# Patient Record
Sex: Male | Born: 1955 | Race: White | Hispanic: No | Marital: Married | Smoking: Never smoker
Health system: Southern US, Community
[De-identification: ages and names within clinical notes are randomized; demographics above are authoritative.]

## PROBLEM LIST (undated history)

## (undated) DIAGNOSIS — G459 Transient cerebral ischemic attack, unspecified: Secondary | ICD-10-CM

## (undated) DIAGNOSIS — R0600 Dyspnea, unspecified: Secondary | ICD-10-CM

## (undated) DIAGNOSIS — N289 Disorder of kidney and ureter, unspecified: Secondary | ICD-10-CM

## (undated) DIAGNOSIS — I2510 Atherosclerotic heart disease of native coronary artery without angina pectoris: Secondary | ICD-10-CM

## (undated) DIAGNOSIS — R011 Cardiac murmur, unspecified: Secondary | ICD-10-CM

## (undated) DIAGNOSIS — G4730 Sleep apnea, unspecified: Secondary | ICD-10-CM

## (undated) DIAGNOSIS — M1990 Unspecified osteoarthritis, unspecified site: Secondary | ICD-10-CM

## (undated) DIAGNOSIS — I639 Cerebral infarction, unspecified: Secondary | ICD-10-CM

## (undated) DIAGNOSIS — Z7960 Long term (current) use of unspecified immunomodulators and immunosuppressants: Secondary | ICD-10-CM

## (undated) DIAGNOSIS — F419 Anxiety disorder, unspecified: Secondary | ICD-10-CM

## (undated) DIAGNOSIS — R42 Dizziness and giddiness: Secondary | ICD-10-CM

## (undated) DIAGNOSIS — Z79899 Other long term (current) drug therapy: Secondary | ICD-10-CM

## (undated) DIAGNOSIS — G4733 Obstructive sleep apnea (adult) (pediatric): Secondary | ICD-10-CM

## (undated) DIAGNOSIS — Z950 Presence of cardiac pacemaker: Secondary | ICD-10-CM

## (undated) DIAGNOSIS — F411 Generalized anxiety disorder: Secondary | ICD-10-CM

## (undated) DIAGNOSIS — I495 Sick sinus syndrome: Secondary | ICD-10-CM

## (undated) DIAGNOSIS — R0609 Other forms of dyspnea: Secondary | ICD-10-CM

## (undated) DIAGNOSIS — C44309 Unspecified malignant neoplasm of skin of other parts of face: Secondary | ICD-10-CM

## (undated) DIAGNOSIS — Z9889 Other specified postprocedural states: Secondary | ICD-10-CM

## (undated) DIAGNOSIS — C801 Malignant (primary) neoplasm, unspecified: Secondary | ICD-10-CM

## (undated) DIAGNOSIS — L4050 Arthropathic psoriasis, unspecified: Secondary | ICD-10-CM

## (undated) DIAGNOSIS — I5189 Other ill-defined heart diseases: Secondary | ICD-10-CM

## (undated) DIAGNOSIS — E785 Hyperlipidemia, unspecified: Secondary | ICD-10-CM

## (undated) DIAGNOSIS — I10 Essential (primary) hypertension: Secondary | ICD-10-CM

## (undated) HISTORY — DX: Sick sinus syndrome: I49.5

## (undated) HISTORY — DX: Atherosclerotic heart disease of native coronary artery without angina pectoris: I25.10

## (undated) HISTORY — DX: Other forms of dyspnea: R06.09

## (undated) HISTORY — PX: COLONOSCOPY: SHX174

## (undated) HISTORY — DX: Disorder of kidney and ureter, unspecified: N28.9

## (undated) HISTORY — PX: TONSILLECTOMY: SUR1361

## (undated) HISTORY — DX: Other ill-defined heart diseases: I51.89

## (undated) HISTORY — DX: Generalized anxiety disorder: F41.1

## (undated) HISTORY — PX: CARDIAC CATHETERIZATION: SHX172

## (undated) HISTORY — DX: Transient cerebral ischemic attack, unspecified: G45.9

## (undated) HISTORY — PX: TOTAL ANKLE REPLACEMENT: SUR1218

## (undated) HISTORY — PX: DIAGNOSTIC LAPAROSCOPY: SUR761

## (undated) HISTORY — DX: Dizziness and giddiness: R42

## (undated) HISTORY — DX: Arthropathic psoriasis, unspecified: L40.50

## (undated) HISTORY — DX: Dyspnea, unspecified: R06.00

---
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
## 1983-03-14 DIAGNOSIS — Z950 Presence of cardiac pacemaker: Secondary | ICD-10-CM

## 1983-03-14 HISTORY — PX: PACEMAKER INSERTION: SHX728

## 1983-03-14 HISTORY — DX: Presence of cardiac pacemaker: Z95.0

## 2000-05-23 ENCOUNTER — Encounter: Admission: RE | Admit: 2000-05-23 | Discharge: 2000-05-23 | Payer: Self-pay | Admitting: Internal Medicine

## 2002-03-13 DIAGNOSIS — Z9889 Other specified postprocedural states: Secondary | ICD-10-CM

## 2002-03-13 HISTORY — DX: Other specified postprocedural states: Z98.890

## 2004-03-13 HISTORY — PX: HAND SURGERY: SHX662

## 2005-03-13 HISTORY — PX: JOINT REPLACEMENT: SHX530

## 2005-07-11 ENCOUNTER — Ambulatory Visit: Payer: Self-pay | Admitting: Unknown Physician Specialty

## 2005-11-20 ENCOUNTER — Emergency Department: Payer: Self-pay | Admitting: General Practice

## 2006-03-13 HISTORY — PX: CHOLECYSTECTOMY: SHX55

## 2006-08-15 ENCOUNTER — Other Ambulatory Visit: Payer: Self-pay

## 2006-08-15 ENCOUNTER — Ambulatory Visit: Payer: Self-pay | Admitting: Surgery

## 2006-08-17 ENCOUNTER — Ambulatory Visit: Payer: Self-pay | Admitting: Surgery

## 2007-02-08 ENCOUNTER — Ambulatory Visit: Payer: Self-pay | Admitting: Cardiology

## 2007-02-22 ENCOUNTER — Ambulatory Visit: Payer: Self-pay | Admitting: Otolaryngology

## 2007-03-14 HISTORY — PX: SHOULDER SURGERY: SHX246

## 2007-03-28 ENCOUNTER — Ambulatory Visit: Payer: Self-pay | Admitting: Internal Medicine

## 2007-04-25 ENCOUNTER — Ambulatory Visit: Payer: Self-pay | Admitting: Internal Medicine

## 2007-05-10 ENCOUNTER — Ambulatory Visit: Payer: Self-pay | Admitting: Internal Medicine

## 2007-08-05 ENCOUNTER — Ambulatory Visit: Payer: Self-pay | Admitting: General Practice

## 2007-08-20 ENCOUNTER — Inpatient Hospital Stay: Payer: Self-pay | Admitting: General Practice

## 2008-03-13 HISTORY — PX: ELBOW SURGERY: SHX618

## 2009-03-13 HISTORY — PX: ICD GENERATOR CHANGE: SHX5854

## 2009-04-09 ENCOUNTER — Ambulatory Visit (HOSPITAL_COMMUNITY): Admission: RE | Admit: 2009-04-09 | Discharge: 2009-04-09 | Payer: Self-pay | Admitting: Neurosurgery

## 2009-06-07 ENCOUNTER — Ambulatory Visit: Payer: Self-pay | Admitting: Internal Medicine

## 2009-06-08 ENCOUNTER — Inpatient Hospital Stay: Payer: Self-pay | Admitting: Internal Medicine

## 2009-06-23 ENCOUNTER — Ambulatory Visit: Payer: Self-pay | Admitting: Unknown Physician Specialty

## 2009-08-03 ENCOUNTER — Ambulatory Visit: Payer: Self-pay | Admitting: Pain Medicine

## 2009-08-11 ENCOUNTER — Ambulatory Visit: Payer: Self-pay | Admitting: Pain Medicine

## 2009-09-07 ENCOUNTER — Ambulatory Visit: Payer: Self-pay | Admitting: Pain Medicine

## 2009-09-20 ENCOUNTER — Ambulatory Visit: Payer: Self-pay | Admitting: Pain Medicine

## 2009-11-02 ENCOUNTER — Ambulatory Visit: Payer: Self-pay | Admitting: Pain Medicine

## 2009-11-24 ENCOUNTER — Ambulatory Visit: Payer: Self-pay | Admitting: Pain Medicine

## 2010-01-13 ENCOUNTER — Ambulatory Visit: Payer: Self-pay | Admitting: Pain Medicine

## 2010-03-09 ENCOUNTER — Ambulatory Visit: Payer: Self-pay | Admitting: Pain Medicine

## 2010-03-22 ENCOUNTER — Ambulatory Visit: Payer: Self-pay | Admitting: Pain Medicine

## 2010-03-28 ENCOUNTER — Inpatient Hospital Stay: Payer: Self-pay | Admitting: Internal Medicine

## 2010-04-18 ENCOUNTER — Ambulatory Visit: Payer: Self-pay | Admitting: Surgery

## 2010-05-10 ENCOUNTER — Ambulatory Visit: Payer: Self-pay | Admitting: Pain Medicine

## 2010-05-13 ENCOUNTER — Ambulatory Visit: Payer: Self-pay | Admitting: Surgery

## 2010-05-16 ENCOUNTER — Ambulatory Visit: Payer: Self-pay | Admitting: Pain Medicine

## 2010-05-16 LAB — PATHOLOGY REPORT

## 2010-05-20 ENCOUNTER — Ambulatory Visit: Payer: Self-pay | Admitting: General Practice

## 2010-06-10 ENCOUNTER — Ambulatory Visit: Payer: Self-pay | Admitting: General Practice

## 2010-06-24 ENCOUNTER — Ambulatory Visit: Payer: Self-pay | Admitting: General Practice

## 2010-06-30 ENCOUNTER — Ambulatory Visit: Payer: Self-pay | Admitting: Cardiology

## 2010-07-01 ENCOUNTER — Ambulatory Visit: Payer: Self-pay | Admitting: Cardiology

## 2010-07-05 ENCOUNTER — Ambulatory Visit: Payer: Self-pay | Admitting: Pain Medicine

## 2010-07-13 ENCOUNTER — Ambulatory Visit: Payer: Self-pay | Admitting: Pain Medicine

## 2010-09-08 ENCOUNTER — Ambulatory Visit: Payer: Self-pay | Admitting: Pain Medicine

## 2010-12-19 ENCOUNTER — Ambulatory Visit: Payer: Self-pay | Admitting: Pain Medicine

## 2011-01-10 ENCOUNTER — Ambulatory Visit: Payer: Self-pay | Admitting: Pain Medicine

## 2011-05-22 ENCOUNTER — Ambulatory Visit: Payer: Self-pay | Admitting: Pain Medicine

## 2011-07-11 ENCOUNTER — Ambulatory Visit: Payer: Self-pay | Admitting: Pain Medicine

## 2011-08-21 ENCOUNTER — Ambulatory Visit: Payer: Self-pay | Admitting: Pain Medicine

## 2011-09-15 ENCOUNTER — Ambulatory Visit: Payer: Self-pay | Admitting: Internal Medicine

## 2011-09-21 ENCOUNTER — Ambulatory Visit: Payer: Self-pay | Admitting: Pain Medicine

## 2011-10-16 ENCOUNTER — Ambulatory Visit: Payer: Self-pay | Admitting: Pain Medicine

## 2011-12-19 ENCOUNTER — Ambulatory Visit: Payer: Self-pay | Admitting: Pain Medicine

## 2012-01-08 ENCOUNTER — Ambulatory Visit: Payer: Self-pay | Admitting: Pain Medicine

## 2012-01-30 ENCOUNTER — Ambulatory Visit: Payer: Self-pay | Admitting: Pain Medicine

## 2012-03-05 ENCOUNTER — Encounter: Payer: Self-pay | Admitting: Rheumatology

## 2012-03-13 ENCOUNTER — Encounter: Payer: Self-pay | Admitting: Rheumatology

## 2012-06-04 ENCOUNTER — Ambulatory Visit: Payer: Self-pay | Admitting: Pain Medicine

## 2012-09-03 ENCOUNTER — Ambulatory Visit: Payer: Self-pay | Admitting: Pain Medicine

## 2012-09-05 ENCOUNTER — Ambulatory Visit: Payer: Self-pay | Admitting: Orthopedic Surgery

## 2012-09-10 ENCOUNTER — Ambulatory Visit: Payer: Self-pay | Admitting: Orthopedic Surgery

## 2012-11-27 ENCOUNTER — Ambulatory Visit: Payer: Self-pay | Admitting: Pain Medicine

## 2012-12-18 ENCOUNTER — Ambulatory Visit: Payer: Self-pay | Admitting: Pain Medicine

## 2013-02-04 ENCOUNTER — Ambulatory Visit: Payer: Self-pay | Admitting: Orthopedic Surgery

## 2013-02-11 ENCOUNTER — Ambulatory Visit: Payer: Self-pay | Admitting: Pain Medicine

## 2013-03-24 DIAGNOSIS — M19079 Primary osteoarthritis, unspecified ankle and foot: Secondary | ICD-10-CM | POA: Insufficient documentation

## 2013-03-24 DIAGNOSIS — M19179 Post-traumatic osteoarthritis, unspecified ankle and foot: Secondary | ICD-10-CM | POA: Insufficient documentation

## 2013-05-13 ENCOUNTER — Ambulatory Visit: Payer: Self-pay | Admitting: Pain Medicine

## 2013-07-01 ENCOUNTER — Ambulatory Visit: Payer: Self-pay | Admitting: Pain Medicine

## 2013-07-14 DIAGNOSIS — G4730 Sleep apnea, unspecified: Secondary | ICD-10-CM | POA: Insufficient documentation

## 2013-07-14 DIAGNOSIS — Z950 Presence of cardiac pacemaker: Secondary | ICD-10-CM | POA: Insufficient documentation

## 2013-07-14 DIAGNOSIS — Z8673 Personal history of transient ischemic attack (TIA), and cerebral infarction without residual deficits: Secondary | ICD-10-CM | POA: Insufficient documentation

## 2014-01-22 DIAGNOSIS — M1990 Unspecified osteoarthritis, unspecified site: Secondary | ICD-10-CM | POA: Insufficient documentation

## 2014-03-22 ENCOUNTER — Emergency Department: Payer: Self-pay | Admitting: Internal Medicine

## 2014-03-22 LAB — COMPREHENSIVE METABOLIC PANEL
ANION GAP: 7 (ref 7–16)
AST: 48 U/L — AB (ref 15–37)
Albumin: 3.8 g/dL (ref 3.4–5.0)
Alkaline Phosphatase: 86 U/L
BILIRUBIN TOTAL: 0.9 mg/dL (ref 0.2–1.0)
BUN: 23 mg/dL — AB (ref 7–18)
CHLORIDE: 103 mmol/L (ref 98–107)
Calcium, Total: 9.3 mg/dL (ref 8.5–10.1)
Co2: 27 mmol/L (ref 21–32)
Creatinine: 1.37 mg/dL — ABNORMAL HIGH (ref 0.60–1.30)
EGFR (Non-African Amer.): 56 — ABNORMAL LOW
Glucose: 101 mg/dL — ABNORMAL HIGH (ref 65–99)
OSMOLALITY: 278 (ref 275–301)
POTASSIUM: 4.2 mmol/L (ref 3.5–5.1)
SGPT (ALT): 66 U/L — ABNORMAL HIGH
SODIUM: 137 mmol/L (ref 136–145)
TOTAL PROTEIN: 7.6 g/dL (ref 6.4–8.2)

## 2014-03-22 LAB — CBC
HCT: 45.6 % (ref 40.0–52.0)
HGB: 14.6 g/dL (ref 13.0–18.0)
MCH: 30.6 pg (ref 26.0–34.0)
MCHC: 32 g/dL (ref 32.0–36.0)
MCV: 96 fL (ref 80–100)
Platelet: 204 10*3/uL (ref 150–440)
RBC: 4.76 10*6/uL (ref 4.40–5.90)
RDW: 14.6 % — AB (ref 11.5–14.5)
WBC: 14.1 10*3/uL — AB (ref 3.8–10.6)

## 2014-03-22 LAB — LIPASE, BLOOD: Lipase: 279 U/L (ref 73–393)

## 2014-03-22 LAB — D-DIMER(ARMC): D-DIMER: 2386 ng/mL

## 2014-03-22 LAB — TROPONIN I

## 2014-03-27 DIAGNOSIS — E782 Mixed hyperlipidemia: Secondary | ICD-10-CM | POA: Insufficient documentation

## 2014-04-24 DIAGNOSIS — F411 Generalized anxiety disorder: Secondary | ICD-10-CM | POA: Insufficient documentation

## 2014-07-03 NOTE — Op Note (Signed)
PATIENT NAME:  Roger Austin, Roger Austin MR#:  [id number] DATE OF BIRTH:  December 03, 1955  DATE OF PROCEDURE:  02/04/2013  PREOPERATIVE DIAGNOSIS: Left olecranon bursitis and spur. Left lateral epicondylitis. Cyst, left middle finger PIP joint, dorsal.   POSTOPERATIVE DIAGNOSIS:  Left olecranon bursitis and spur. Left lateral epicondylitis. Cyst, left middle finger PIP joint, dorsal.   PROCEDURE: Excision, left olecranon bursa and spur. Nirschl procedure, left lateral epicondyle. Excision of cyst, left middle finger dorsal PIP joint.   ANESTHESIA: General.   SURGEON: Hessie Knows, M.D.   DESCRIPTION OF PROCEDURE: The patient was brought to the operating room and after adequate anesthesia was obtained, the arm was prepped and draped in the usual sterile fashion with a tourniquet applied to the upper arm. After prepping and draping in the usual sterile fashion, appropriate patient identification and timeout procedures were completed, and the tourniquet raised to 250 mmHg. A curvilinear incision was made over the dorsum of the PIP joint and the cyst exposed. It appeared to arise from the joint and came through the extensor mechanism. It was debrided at its base and cautery was applied to the defect in the extensor tendon, in hopes of promoting scar tissue and prevent recurrence. The wound was closed with simple interrupted 5-0 nylon skin suture with 5 mL of 0.5% Sensorcaine without epinephrine infiltrated proximal to this on the dorsum of the hand to give postoperative pain relief. Attention was then applied to the olecranon bursa and spur. A small incision was made just on the lateral aspect of the olecranon. The subcutaneous tissue was divided and bursa debrided. The palpable spur was identified and soft tissue elevated off of this. Osteotome and rongeur were used to remove the most prominent portion and a rasp used to smooth the edges. The wound was irrigated and then closed with a 2-0 Vicryl to repair portion of  the extensor mechanism that had been elevated, and 4-0 nylon for the skin. Going to the lateral epicondyle, approximately 2.5 cm incision was made directly over the lateral epicondyle. The incision was carried down through the skin and subcutaneous tissue, and the lateral epicondyle was exposed with elevation of the anterior and posterior soft tissues. There was an area of inflammatory tissue consistent with chronic lateral epicondylitis. This was debrided with use of rongeur, as was a portion of the lateral epicondyle, decorticating the bone and getting to bleeding cancellus bone. After getting  the edges smooth, the wound was irrigated, and the anterior and posterior structures repaired over this decorticated bone using a 2-0 Vicryl. Skin was closed with 4-0 nylon. A total of 15 additional milliliters were injected into the area of the lateral epicondyle and olecranon incisions for postop analgesia. The wound was then covered with Xeroform, 4 x 4's, Webril and a posterior splint to maintain 90 degrees of elbow flexion with Xeroform, 4 x 4, and 1 inch Kling around the finger. The patient was then sent to the recovery room in stable condition after letting down the tourniquet. Tourniquet time was 43 minutes at 250 mmHg.   SPECIMEN: Excised cyst, left middle finger.     ____________________________ Laurene Footman, MD mjm:dmm D: 02/04/2013 21:19:26 ET T: 02/04/2013 21:33:18 ET JOB#: [id number]  cc: Laurene Footman, MD, <Dictator> Laurene Footman MD ELECTRONICALLY SIGNED 02/05/2013 8:02

## 2014-07-03 NOTE — Op Note (Signed)
PATIENT NAME:  Roger Austin, Roger Austin MR#:  [id number] DATE OF BIRTH:  Sep 11, 1955  DATE OF PROCEDURE:  09/10/2012  PREOPERATIVE DIAGNOSIS:  Right olecranon bursitis and chronic lateral epicondylitis.  POSTOPERATIVE DIAGNOSIS:  Right olecranon bursitis and chronic lateral epicondylitis.   PROCEDURE PERFORMED:  Olecranon bursa excision and Nirscl procedure, right elbow.    ANESTHESIA:  General.  SURGEON:  Laurene Footman, M.D.   DESCRIPTION OF PROCEDURE:  The patient was brought to the Operating Room and after adequate anesthesia was obtained the right arm was prepped and draped in the usual sterile fashion.  After patient identification and timeout procedures were completed, the tourniquet was raised to 250 mmHg.  The lateral epicondyle was approached first with the incision centered directly over the epicondyle.  After exposing the bone the tendon origin was elevated off the bone.  There was granulation tissue consistent with chronic inflammation.  This was debrided with the use of a rongeur and the epicondyle was decorticated to allow for subsequent healing.  It had a smooth surface and after adequate debridement the wound was irrigated and then closed with a subcuticular 4-0 Monocryl and Dermabond.  Going to the olecranon, incision was made just radial to the olecranon approximately 1 inch in length.  There was a very thick bursa present with fluid.  This was excised off the bone as well as skin.  After this was removed the palpable spur was debrided with use of a rongeur.  The wound was irrigated and then closed in a similar fashion.  10 mL of 0.5% Sensorcaine was infiltrated around the incisions to aid in postoperative analgesia.  Sterile dressing of Telfa, ABD, Webril and a posterior splint were applied.  The patient is taken to the recovery room in stable condition.   ESTIMATED BLOOD LOSS:  Minimal.   COMPLICATIONS:  None.   SPECIMEN:  None.   TOURNIQUET TIME:  29 minutes at 250 mmHg.      ____________________________ Laurene Footman, MD mjm:ea D: 09/10/2012 22:22:20 ET T: 09/11/2012 02:13:52 ET JOB#: [id number]  cc: Laurene Footman, MD, <Dictator> Laurene Footman MD ELECTRONICALLY SIGNED 09/11/2012 7:38

## 2014-10-12 DIAGNOSIS — I10 Essential (primary) hypertension: Secondary | ICD-10-CM | POA: Insufficient documentation

## 2015-04-13 ENCOUNTER — Other Ambulatory Visit: Payer: Self-pay | Admitting: Rheumatology

## 2015-04-13 DIAGNOSIS — M25511 Pain in right shoulder: Secondary | ICD-10-CM

## 2015-04-16 ENCOUNTER — Ambulatory Visit
Admission: RE | Admit: 2015-04-16 | Discharge: 2015-04-16 | Disposition: A | Discharge status: Home / Self Care | Payer: 59 | Source: Ambulatory Visit | Attending: Rheumatology | Admitting: Rheumatology

## 2015-04-16 DIAGNOSIS — M25411 Effusion, right shoulder: Secondary | ICD-10-CM | POA: Diagnosis not present

## 2015-04-16 DIAGNOSIS — M948X1 Other specified disorders of cartilage, shoulder: Secondary | ICD-10-CM | POA: Insufficient documentation

## 2015-04-16 DIAGNOSIS — M25511 Pain in right shoulder: Secondary | ICD-10-CM

## 2015-04-16 LAB — POCT I-STAT CREATININE: CREATININE: 1.4 mg/dL — AB (ref 0.61–1.24)

## 2015-04-16 MED ORDER — IOHEXOL 350 MG/ML SOLN
75.0000 mL | Freq: Once | INTRAVENOUS | Status: AC | PRN
  Administered 2015-04-16: 75 mL via INTRAVENOUS

## 2015-04-27 ENCOUNTER — Encounter
Admission: RE | Admit: 2015-04-27 | Discharge: 2015-04-27 | Disposition: A | Discharge status: Home / Self Care | Payer: 59 | Source: Ambulatory Visit | Attending: Orthopedic Surgery | Admitting: Orthopedic Surgery

## 2015-04-27 DIAGNOSIS — Z01812 Encounter for preprocedural laboratory examination: Secondary | ICD-10-CM | POA: Diagnosis present

## 2015-04-27 DIAGNOSIS — Z01810 Encounter for preprocedural cardiovascular examination: Secondary | ICD-10-CM | POA: Diagnosis present

## 2015-04-27 HISTORY — DX: Hyperlipidemia, unspecified: E78.5

## 2015-04-27 HISTORY — DX: Anxiety disorder, unspecified: F41.9

## 2015-04-27 HISTORY — DX: Presence of cardiac pacemaker: Z95.0

## 2015-04-27 HISTORY — DX: Other specified postprocedural states: Z98.890

## 2015-04-27 HISTORY — DX: Cardiac murmur, unspecified: R01.1

## 2015-04-27 LAB — BASIC METABOLIC PANEL
Anion gap: 7 (ref 5–15)
BUN: 26 mg/dL — AB (ref 6–20)
CHLORIDE: 106 mmol/L (ref 101–111)
CO2: 28 mmol/L (ref 22–32)
CREATININE: 1.18 mg/dL (ref 0.61–1.24)
Calcium: 9.2 mg/dL (ref 8.9–10.3)
GFR calc Af Amer: >60 mL/min (ref >60)
GFR calc non Af Amer: >60 mL/min (ref >60)
GLUCOSE: 109 mg/dL — AB (ref 65–99)
POTASSIUM: 3.6 mmol/L (ref 3.5–5.1)
SODIUM: 141 mmol/L (ref 135–145)

## 2015-04-27 LAB — ABO/RH: ABO/RH(D): O POS

## 2015-04-27 LAB — TYPE AND SCREEN
ABO/RH(D): O POS
ANTIBODY SCREEN: NEGATIVE

## 2015-04-27 LAB — URINALYSIS COMPLETE WITH MICROSCOPIC (ARMC ONLY)
Bacteria, UA: NONE SEEN
Bilirubin Urine: NEGATIVE
GLUCOSE, UA: NEGATIVE mg/dL
Hgb urine dipstick: NEGATIVE
KETONES UR: NEGATIVE mg/dL
Leukocytes, UA: NEGATIVE
NITRITE: NEGATIVE
PROTEIN: NEGATIVE mg/dL
RBC / HPF: NONE SEEN RBC/hpf (ref 0–5)
Specific Gravity, Urine: 1.01 (ref 1.005–1.030)
pH: 6 (ref 5.0–8.0)

## 2015-04-27 LAB — HEMOGLOBIN: HEMOGLOBIN: 14.9 g/dL (ref 13.0–18.0)

## 2015-04-27 LAB — PROTIME-INR
INR: 1.04
Prothrombin Time: 13.8 seconds (ref 11.4–15.0)

## 2015-04-27 LAB — APTT: APTT: 29 s (ref 24–36)

## 2015-04-27 LAB — SURGICAL PCR SCREEN
MRSA, PCR: POSITIVE — AB
Staphylococcus aureus: POSITIVE — AB

## 2015-04-27 LAB — SEDIMENTATION RATE: SED RATE: 3 mm/h (ref 0–20)

## 2015-04-27 NOTE — Patient Instructions (Signed)
  Your procedure is scheduled on: May 05, 2015 Report to Day Surgery.Medical Mall, Second Floor To find out your arrival time please call [phone] between 1PM - 3PM on Tuesday, May 04, 2015  Remember: Instructions that are not followed completely may result in serious medical risk, up to and including death, or upon the discretion of your surgeon and anesthesiologist your surgery may need to be rescheduled.    ___x_ 1. Do not eat food or drink liquids after midnight. No gum chewing or hard candies.     __x__ 2. No Alcohol for 24 hours before or after surgery.   ____ 3. Bring all medications with you on the day of surgery if instructed.    __x__ 4. Notify your doctor if there is any change in your medical condition     (cold, fever, infections).     Do not wear jewelry, make-up, hairpins, clips or nail polish.  Do not wear lotions, powders, or perfumes. You may wear deodorant.  Do not shave 48 hours prior to surgery. Men may shave face and neck.  Do not bring valuables to the hospital.    [facility] is not responsible for any belongings or valuables.               Contacts, dentures or bridgework may not be worn into surgery.  Leave your suitcase in the car. After surgery it may be brought to your room.  For patients admitted to the hospital, discharge time is determined by your                treatment team.   Patients discharged the day of surgery will not be allowed to drive home.   Please read over the following fact sheets that you were given:   Surgical Site Infection Prevention   ____ Take these medicines the morning of surgery with A SIP OF WATER:    1. zoloft  2. allopurinol  3. neurontin  4.you may continue celebrex but do not take on morning of surgery  5.  6.  ____ Fleet Enema (as directed)   __X__ Use CHG Soap as directed  ____ Use inhalers on the day of surgery  ____ Stop metformin 2 days prior to surgery    ____ Take 1/2 of usual insulin  dose the night before surgery and none on the morning of surgery.   __X__ Stop Coumadin/Plavix/aspirin on  April 28, 2015  ____ Stop Anti-inflammatories on   ____ Stop supplements until after surgery.    ____ Bring C-Pap to the hospital.

## 2015-04-27 NOTE — Pre-Procedure Instructions (Deleted)
PATIENT HAS A [employer] PACEMAKER

## 2015-04-28 LAB — CBC
HEMATOCRIT: 45.2 % (ref 40.0–52.0)
HEMOGLOBIN: 14.9 g/dL (ref 13.0–18.0)
MCH: 31.6 pg (ref 26.0–34.0)
MCHC: 33.1 g/dL (ref 32.0–36.0)
MCV: 95.6 fL (ref 80.0–100.0)
Platelets: 205 10*3/uL (ref 150–440)
RBC: 4.73 MIL/uL (ref 4.40–5.90)
RDW: 14.3 % (ref 11.5–14.5)
WBC: 7.6 10*3/uL (ref 3.8–10.6)

## 2015-04-28 NOTE — Pre-Procedure Instructions (Signed)
Met B results sent to Anesthesia and Dr. Marry Guan for review.  Positive MRSA and Staph aureus results faxed to Dr. Marry Guan, asked if wanted any treatment and/or other antibiotic?  OR notified.

## 2015-04-29 LAB — URINE CULTURE: SPECIAL REQUESTS: NORMAL

## 2015-05-05 ENCOUNTER — Inpatient Hospital Stay: Payer: 59 | Admitting: Certified Registered Nurse Anesthetist

## 2015-05-05 ENCOUNTER — Encounter: Payer: Self-pay | Admitting: Orthopedic Surgery

## 2015-05-05 ENCOUNTER — Inpatient Hospital Stay
Admission: RE | Admit: 2015-05-05 | Discharge: 2015-05-07 | DRG: 470 | Disposition: A | Discharge status: Home Health | Payer: 59 | Source: Ambulatory Visit | Attending: Orthopedic Surgery | Admitting: Orthopedic Surgery

## 2015-05-05 ENCOUNTER — Inpatient Hospital Stay: Payer: 59

## 2015-05-05 ENCOUNTER — Encounter
Admission: RE | Disposition: A | Discharge status: Home Health | Payer: Self-pay | Source: Ambulatory Visit | Attending: Orthopedic Surgery

## 2015-05-05 DIAGNOSIS — Z8673 Personal history of transient ischemic attack (TIA), and cerebral infarction without residual deficits: Secondary | ICD-10-CM | POA: Diagnosis not present

## 2015-05-05 DIAGNOSIS — M1732 Unilateral post-traumatic osteoarthritis, left knee: Principal | ICD-10-CM | POA: Diagnosis present

## 2015-05-05 DIAGNOSIS — G8929 Other chronic pain: Secondary | ICD-10-CM | POA: Diagnosis present

## 2015-05-05 DIAGNOSIS — Z880 Allergy status to penicillin: Secondary | ICD-10-CM

## 2015-05-05 DIAGNOSIS — M549 Dorsalgia, unspecified: Secondary | ICD-10-CM | POA: Diagnosis present

## 2015-05-05 DIAGNOSIS — G4733 Obstructive sleep apnea (adult) (pediatric): Secondary | ICD-10-CM | POA: Diagnosis present

## 2015-05-05 DIAGNOSIS — Z96661 Presence of right artificial ankle joint: Secondary | ICD-10-CM | POA: Diagnosis present

## 2015-05-05 DIAGNOSIS — E785 Hyperlipidemia, unspecified: Secondary | ICD-10-CM | POA: Diagnosis present

## 2015-05-05 DIAGNOSIS — I10 Essential (primary) hypertension: Secondary | ICD-10-CM | POA: Diagnosis present

## 2015-05-05 DIAGNOSIS — Z96652 Presence of left artificial knee joint: Secondary | ICD-10-CM

## 2015-05-05 DIAGNOSIS — M109 Gout, unspecified: Secondary | ICD-10-CM | POA: Diagnosis present

## 2015-05-05 DIAGNOSIS — Z96659 Presence of unspecified artificial knee joint: Secondary | ICD-10-CM

## 2015-05-05 HISTORY — PX: KNEE ARTHROPLASTY: SHX992

## 2015-05-05 HISTORY — PX: HARDWARE REMOVAL: SHX979

## 2015-05-05 SURGERY — ARTHROPLASTY, KNEE, TOTAL, USING IMAGELESS COMPUTER-ASSISTED NAVIGATION
Anesthesia: Spinal | Site: Knee | Laterality: Left | Wound class: Clean

## 2015-05-05 MED ORDER — FAMOTIDINE 20 MG PO TABS
ORAL_TABLET | ORAL | Status: AC
  Administered 2015-05-05: 20 mg via ORAL
  Filled 2015-05-05: qty 1

## 2015-05-05 MED ORDER — ALLOPURINOL 300 MG PO TABS
300.0000 mg | ORAL_TABLET | Freq: Every day | ORAL | Status: DC
  Administered 2015-05-06 – 2015-05-07 (×2): 300 mg via ORAL
  Filled 2015-05-05 (×2): qty 1

## 2015-05-05 MED ORDER — TRAMADOL HCL 50 MG PO TABS
50.0000 mg | ORAL_TABLET | ORAL | Status: DC | PRN
  Administered 2015-05-07: 100 mg via ORAL
  Filled 2015-05-05: qty 2
  Filled 2015-05-05: qty 1

## 2015-05-05 MED ORDER — ONDANSETRON HCL 4 MG/2ML IJ SOLN: 4.0000 mg | Freq: Four times a day (QID) | INTRAMUSCULAR | Status: DC | PRN

## 2015-05-05 MED ORDER — CLINDAMYCIN PHOSPHATE 900 MG/50ML IV SOLN: 900.0000 mg | Freq: Once | INTRAVENOUS | Status: DC

## 2015-05-05 MED ORDER — TRANEXAMIC ACID 1000 MG/10ML IV SOLN
1000.0000 mg | Freq: Once | INTRAVENOUS | Status: DC
  Filled 2015-05-05: qty 10

## 2015-05-05 MED ORDER — FENTANYL CITRATE (PF) 100 MCG/2ML IJ SOLN
25.0000 ug | INTRAMUSCULAR | Status: DC | PRN
  Administered 2015-05-05 (×4): 25 ug via INTRAVENOUS

## 2015-05-05 MED ORDER — ACETAMINOPHEN 10 MG/ML IV SOLN
1000.0000 mg | Freq: Four times a day (QID) | INTRAVENOUS | Status: AC
  Administered 2015-05-05 – 2015-05-06 (×3): 1000 mg via INTRAVENOUS
  Filled 2015-05-05 (×4): qty 100

## 2015-05-05 MED ORDER — BISACODYL 10 MG RE SUPP
10.0000 mg | Freq: Every day | RECTAL | Status: DC | PRN
Start: 2015-05-05 — End: 2015-05-07

## 2015-05-05 MED ORDER — FENTANYL CITRATE (PF) 100 MCG/2ML IJ SOLN
INTRAMUSCULAR | Status: DC | PRN
  Administered 2015-05-05 (×2): 25 ug via INTRAVENOUS
  Administered 2015-05-05: 50 ug via INTRAVENOUS

## 2015-05-05 MED ORDER — SENNOSIDES-DOCUSATE SODIUM 8.6-50 MG PO TABS
1.0000 | ORAL_TABLET | Freq: Two times a day (BID) | ORAL | Status: DC
  Administered 2015-05-05 – 2015-05-06 (×3): 1 via ORAL
  Filled 2015-05-05 (×4): qty 1

## 2015-05-05 MED ORDER — ACETAMINOPHEN 10 MG/ML IV SOLN
INTRAVENOUS | Status: DC | PRN
  Administered 2015-05-05: 1000 mg via INTRAVENOUS

## 2015-05-05 MED ORDER — SODIUM CHLORIDE 0.9 % IV SOLN: INTRAVENOUS | Status: DC | PRN

## 2015-05-05 MED ORDER — TETRACAINE HCL 1 % IJ SOLN
INTRAMUSCULAR | Status: DC | PRN
  Administered 2015-05-05: 5 mg via INTRASPINAL

## 2015-05-05 MED ORDER — MIDAZOLAM HCL 5 MG/5ML IJ SOLN
INTRAMUSCULAR | Status: DC | PRN
  Administered 2015-05-05: 2 mg via INTRAVENOUS

## 2015-05-05 MED ORDER — METOCLOPRAMIDE HCL 10 MG PO TABS
10.0000 mg | ORAL_TABLET | Freq: Three times a day (TID) | ORAL | Status: DC
  Administered 2015-05-05 – 2015-05-07 (×7): 10 mg via ORAL
  Filled 2015-05-05 (×8): qty 1

## 2015-05-05 MED ORDER — PHENOL 1.4 % MT LIQD: 1.0000 | OROMUCOSAL | Status: DC | PRN

## 2015-05-05 MED ORDER — FENTANYL CITRATE (PF) 100 MCG/2ML IJ SOLN
INTRAMUSCULAR | Status: AC
  Administered 2015-05-05: 25 ug via INTRAVENOUS
  Filled 2015-05-05: qty 2

## 2015-05-05 MED ORDER — FAMOTIDINE 20 MG PO TABS
20.0000 mg | ORAL_TABLET | Freq: Once | ORAL | Status: AC
  Administered 2015-05-05: 20 mg via ORAL

## 2015-05-05 MED ORDER — BUPIVACAINE HCL (PF) 0.5 % IJ SOLN
INTRAMUSCULAR | Status: DC | PRN
  Administered 2015-05-05: 2.5 mL

## 2015-05-05 MED ORDER — SODIUM CHLORIDE 0.9 % IJ SOLN
INTRAMUSCULAR | Status: AC
  Filled 2015-05-05: qty 50

## 2015-05-05 MED ORDER — BUPIVACAINE-EPINEPHRINE (PF) 0.25% -1:200000 IJ SOLN
INTRAMUSCULAR | Status: AC
  Filled 2015-05-05: qty 30

## 2015-05-05 MED ORDER — ACETAMINOPHEN 325 MG PO TABS: 650.0000 mg | ORAL_TABLET | Freq: Four times a day (QID) | ORAL | Status: DC | PRN

## 2015-05-05 MED ORDER — ENOXAPARIN SODIUM 30 MG/0.3ML ~~LOC~~ SOLN
30.0000 mg | Freq: Two times a day (BID) | SUBCUTANEOUS | Status: DC
  Administered 2015-05-06 – 2015-05-07 (×3): 30 mg via SUBCUTANEOUS
  Filled 2015-05-05 (×3): qty 0.3

## 2015-05-05 MED ORDER — ONDANSETRON HCL 4 MG/2ML IJ SOLN: 4.0000 mg | Freq: Once | INTRAMUSCULAR | Status: DC | PRN

## 2015-05-05 MED ORDER — BUPIVACAINE LIPOSOME 1.3 % IJ SUSP
INTRAMUSCULAR | Status: AC
  Filled 2015-05-05: qty 20

## 2015-05-05 MED ORDER — TETRACAINE HCL 1 % IJ SOLN
INTRAMUSCULAR | Status: AC
  Filled 2015-05-05: qty 2

## 2015-05-05 MED ORDER — SODIUM CHLORIDE 0.9 % IV SOLN: Freq: Once | INTRAVENOUS | Status: DC

## 2015-05-05 MED ORDER — DIPHENHYDRAMINE HCL 12.5 MG/5ML PO ELIX: 12.5000 mg | ORAL_SOLUTION | ORAL | Status: DC | PRN

## 2015-05-05 MED ORDER — NEOMYCIN-POLYMYXIN B GU 40-200000 IR SOLN
Status: DC | PRN
  Administered 2015-05-05: 14 mL

## 2015-05-05 MED ORDER — SODIUM CHLORIDE 0.9 % IV SOLN
INTRAVENOUS | Status: DC | PRN
  Administered 2015-05-05: 60 mL

## 2015-05-05 MED ORDER — SERTRALINE HCL 100 MG PO TABS
100.0000 mg | ORAL_TABLET | Freq: Every day | ORAL | Status: DC
  Administered 2015-05-06 – 2015-05-07 (×2): 100 mg via ORAL
  Filled 2015-05-05 (×2): qty 1

## 2015-05-05 MED ORDER — MENTHOL 3 MG MT LOZG: 1.0000 | LOZENGE | OROMUCOSAL | Status: DC | PRN

## 2015-05-05 MED ORDER — MAGNESIUM HYDROXIDE 400 MG/5ML PO SUSP
30.0000 mL | Freq: Every day | ORAL | Status: DC | PRN
  Administered 2015-05-06: 30 mL via ORAL
  Filled 2015-05-05: qty 30

## 2015-05-05 MED ORDER — FERROUS SULFATE 325 (65 FE) MG PO TABS
325.0000 mg | ORAL_TABLET | Freq: Two times a day (BID) | ORAL | Status: DC
  Administered 2015-05-05 – 2015-05-07 (×4): 325 mg via ORAL
  Filled 2015-05-05 (×4): qty 1

## 2015-05-05 MED ORDER — VANCOMYCIN HCL IN DEXTROSE 1-5 GM/200ML-% IV SOLN
1000.0000 mg | Freq: Once | INTRAVENOUS | Status: AC
  Administered 2015-05-05: 1000 mg via INTRAVENOUS

## 2015-05-05 MED ORDER — LACTATED RINGERS IV SOLN
INTRAVENOUS | Status: DC
  Administered 2015-05-05: 11:00:00 via INTRAVENOUS

## 2015-05-05 MED ORDER — CELECOXIB 200 MG PO CAPS
200.0000 mg | ORAL_CAPSULE | Freq: Two times a day (BID) | ORAL | Status: DC
  Administered 2015-05-05 – 2015-05-07 (×4): 200 mg via ORAL
  Filled 2015-05-05 (×4): qty 1

## 2015-05-05 MED ORDER — SODIUM CHLORIDE 0.9 % IV SOLN
INTRAVENOUS | Status: DC
  Administered 2015-05-06: 05:00:00 via INTRAVENOUS

## 2015-05-05 MED ORDER — CLINDAMYCIN PHOSPHATE 600 MG/50ML IV SOLN
600.0000 mg | Freq: Four times a day (QID) | INTRAVENOUS | Status: AC
  Administered 2015-05-05 – 2015-05-06 (×4): 600 mg via INTRAVENOUS
  Filled 2015-05-05 (×4): qty 50

## 2015-05-05 MED ORDER — PROPOFOL 10 MG/ML IV BOLUS
INTRAVENOUS | Status: DC | PRN
  Administered 2015-05-05: 20 mg via INTRAVENOUS
  Administered 2015-05-05: 10 mg via INTRAVENOUS

## 2015-05-05 MED ORDER — ALUM & MAG HYDROXIDE-SIMETH 200-200-20 MG/5ML PO SUSP: 30.0000 mL | ORAL | Status: DC | PRN

## 2015-05-05 MED ORDER — FLEET ENEMA 7-19 GM/118ML RE ENEM: 1.0000 | ENEMA | Freq: Once | RECTAL | Status: DC | PRN

## 2015-05-05 MED ORDER — CLINDAMYCIN PHOSPHATE 900 MG/50ML IV SOLN
INTRAVENOUS | Status: AC
  Administered 2015-05-05: 900 mg via INTRAVENOUS
  Filled 2015-05-05: qty 50

## 2015-05-05 MED ORDER — ACETAMINOPHEN 10 MG/ML IV SOLN
INTRAVENOUS | Status: AC
Start: 2015-05-05 — End: 2015-05-05
  Filled 2015-05-05: qty 100

## 2015-05-05 MED ORDER — GABAPENTIN 300 MG PO CAPS
300.0000 mg | ORAL_CAPSULE | Freq: Four times a day (QID) | ORAL | Status: DC
  Administered 2015-05-05 – 2015-05-07 (×8): 300 mg via ORAL
  Filled 2015-05-05 (×8): qty 1

## 2015-05-05 MED ORDER — OXYCODONE HCL 5 MG PO TABS
5.0000 mg | ORAL_TABLET | ORAL | Status: DC | PRN
  Administered 2015-05-05 (×2): 5 mg via ORAL
  Administered 2015-05-06 – 2015-05-07 (×5): 10 mg via ORAL
  Administered 2015-05-07 (×2): 5 mg via ORAL
  Filled 2015-05-05 (×2): qty 2
  Filled 2015-05-05 (×2): qty 1
  Filled 2015-05-05 (×2): qty 2
  Filled 2015-05-05: qty 1
  Filled 2015-05-05: qty 2
  Filled 2015-05-05 (×3): qty 1

## 2015-05-05 MED ORDER — ONDANSETRON HCL 4 MG PO TABS: 4.0000 mg | ORAL_TABLET | Freq: Four times a day (QID) | ORAL | Status: DC | PRN

## 2015-05-05 MED ORDER — LACTATED RINGERS IV SOLN: INTRAVENOUS | Status: DC

## 2015-05-05 MED ORDER — EZETIMIBE 10 MG PO TABS
10.0000 mg | ORAL_TABLET | Freq: Every day | ORAL | Status: DC
  Administered 2015-05-06 – 2015-05-07 (×2): 10 mg via ORAL
  Filled 2015-05-05 (×2): qty 1

## 2015-05-05 MED ORDER — BUPIVACAINE-EPINEPHRINE 0.25% -1:200000 IJ SOLN
INTRAMUSCULAR | Status: DC | PRN
  Administered 2015-05-05: 30 mL

## 2015-05-05 MED ORDER — ACETAMINOPHEN 650 MG RE SUPP: 650.0000 mg | Freq: Four times a day (QID) | RECTAL | Status: DC | PRN

## 2015-05-05 MED ORDER — NEOMYCIN-POLYMYXIN B GU 40-200000 IR SOLN
Status: AC
  Filled 2015-05-05: qty 20

## 2015-05-05 MED ORDER — MORPHINE SULFATE (PF) 2 MG/ML IV SOLN
2.0000 mg | INTRAVENOUS | Status: DC | PRN
  Administered 2015-05-06: 2 mg via INTRAVENOUS
  Filled 2015-05-05: qty 1

## 2015-05-05 MED ORDER — PANTOPRAZOLE SODIUM 40 MG PO TBEC
40.0000 mg | DELAYED_RELEASE_TABLET | Freq: Two times a day (BID) | ORAL | Status: DC
  Administered 2015-05-05 – 2015-05-07 (×4): 40 mg via ORAL
  Filled 2015-05-05 (×4): qty 1

## 2015-05-05 MED ORDER — VANCOMYCIN HCL IN DEXTROSE 1-5 GM/200ML-% IV SOLN
INTRAVENOUS | Status: AC
  Administered 2015-05-05: 1000 mg via INTRAVENOUS
  Filled 2015-05-05: qty 200

## 2015-05-05 MED ORDER — PROPOFOL 500 MG/50ML IV EMUL
INTRAVENOUS | Status: DC | PRN
  Administered 2015-05-05: 100 ug/kg/min via INTRAVENOUS

## 2015-05-05 SURGICAL SUPPLY — 57 items
AUTOTRANSFUS HAS 1/8 (MISCELLANEOUS) ×2
BATTERY INSTRU NAVIGATION (MISCELLANEOUS) ×8 IMPLANT
BLADE SAW 1 (BLADE) ×2 IMPLANT
BLADE SAW 1/2 (BLADE) ×2 IMPLANT
BONE CEMENT GENTAMICIN (Cement) ×4 IMPLANT
CANISTER SUCT 1200ML W/VALVE (MISCELLANEOUS) ×2 IMPLANT
CANISTER SUCT 3000ML (MISCELLANEOUS) ×4 IMPLANT
CAP KNEE TOTAL 3 SIGMA ×2 IMPLANT
CATH TRAY METER 16FR LF (MISCELLANEOUS) ×2 IMPLANT
CEMENT BONE GENTAMICIN 40 (Cement) ×2 IMPLANT
COOLER POLAR GLACIER W/PUMP (MISCELLANEOUS) ×2 IMPLANT
DRAPE SHEET LG 3/4 BI-LAMINATE (DRAPES) ×2 IMPLANT
DRSG DERMACEA 8X12 NADH (GAUZE/BANDAGES/DRESSINGS) ×2 IMPLANT
DRSG OPSITE POSTOP 4X14 (GAUZE/BANDAGES/DRESSINGS) ×2 IMPLANT
DRSG TEGADERM 4X4.75 (GAUZE/BANDAGES/DRESSINGS) ×2 IMPLANT
DURAPREP 26ML APPLICATOR (WOUND CARE) ×4 IMPLANT
ELECT CAUTERY BLADE 6.4 (BLADE) ×2 IMPLANT
ELECT REM PT RETURN 9FT ADLT (ELECTROSURGICAL) ×2
ELECTRODE REM PT RTRN 9FT ADLT (ELECTROSURGICAL) ×1 IMPLANT
EX-PIN ORTHOLOCK NAV 4X150 (PIN) ×4 IMPLANT
GLOVE BIOGEL M STRL SZ7.5 (GLOVE) ×4 IMPLANT
GLOVE INDICATOR 8.0 STRL GRN (GLOVE) ×2 IMPLANT
GLOVE SURG 9.0 ORTHO LTXF (GLOVE) ×2 IMPLANT
GLOVE SURG ORTHO 9.0 STRL STRW (GLOVE) ×2 IMPLANT
GOWN STRL REUS W/ TWL LRG LVL3 (GOWN DISPOSABLE) ×2 IMPLANT
GOWN STRL REUS W/TWL 2XL LVL3 (GOWN DISPOSABLE) ×2 IMPLANT
GOWN STRL REUS W/TWL LRG LVL3 (GOWN DISPOSABLE) ×2
HANDPIECE SUCTION TUBG SURGILV (MISCELLANEOUS) ×2 IMPLANT
HOLDER FOLEY CATH W/STRAP (MISCELLANEOUS) ×2 IMPLANT
HOOD PEEL AWAY FLYTE STAYCOOL (MISCELLANEOUS) ×4 IMPLANT
KIT RM TURNOVER STRD PROC AR (KITS) ×2 IMPLANT
KNIFE SCULPS 14X20 (INSTRUMENTS) ×2 IMPLANT
NDL SAFETY 18GX1.5 (NEEDLE) ×2 IMPLANT
NEEDLE SPNL 20GX3.5 QUINCKE YW (NEEDLE) ×2 IMPLANT
NS IRRIG 500ML POUR BTL (IV SOLUTION) ×2 IMPLANT
PACK TOTAL KNEE (MISCELLANEOUS) ×2 IMPLANT
PAD WRAPON POLAR KNEE (MISCELLANEOUS) ×1 IMPLANT
PIN FIXATION 1/8DIA X 3INL (PIN) ×2 IMPLANT
SOL .9 NS 3000ML IRR  AL (IV SOLUTION) ×1
SOL .9 NS 3000ML IRR UROMATIC (IV SOLUTION) ×1 IMPLANT
SOL PREP PVP 2OZ (MISCELLANEOUS) ×2
SOLUTION PREP PVP 2OZ (MISCELLANEOUS) ×1 IMPLANT
SPONGE DRAIN TRACH 4X4 STRL 2S (GAUZE/BANDAGES/DRESSINGS) ×2 IMPLANT
STAPLER SKIN PROX 35W (STAPLE) ×2 IMPLANT
SUCTION FRAZIER HANDLE 10FR (MISCELLANEOUS) ×1
SUCTION TUBE FRAZIER 10FR DISP (MISCELLANEOUS) ×1 IMPLANT
SUT VIC AB 0 CT1 36 (SUTURE) ×2 IMPLANT
SUT VIC AB 1 CT1 36 (SUTURE) ×4 IMPLANT
SUT VIC AB 2-0 CT2 27 (SUTURE) ×2 IMPLANT
SYR 20CC LL (SYRINGE) ×2 IMPLANT
SYR 30ML LL (SYRINGE) ×2 IMPLANT
SYR 50ML LL SCALE MARK (SYRINGE) ×2 IMPLANT
SYSTEM AUTOTRANSFUS DUAL TROCR (MISCELLANEOUS) ×1 IMPLANT
TOWEL OR 17X26 4PK STRL BLUE (TOWEL DISPOSABLE) ×2 IMPLANT
TOWER CARTRIDGE SMART MIX (DISPOSABLE) ×2 IMPLANT
TRAY REVISION SZ 4 (Knees) ×2 IMPLANT
WRAPON POLAR PAD KNEE (MISCELLANEOUS) ×2

## 2015-05-05 NOTE — Op Note (Signed)
OPERATIVE NOTE  DATE OF SURGERY:  05/05/2015  PATIENT NAME:  Roger Austin   DOB: 1955/10/08  MRN: [id number]  PRE-OPERATIVE DIAGNOSIS: Degenerative arthrosis of the left knee, primary Retained hardware to the proximal tibia (status post proximal tibial osteotomy)  POST-OPERATIVE DIAGNOSIS:  Same  PROCEDURE:  Left total knee arthroplasty using computer-assisted navigation Hardware removal from the right tibia (4 screws and one plate)  SURGEON:  Marciano Sequin. M.D.  ASSISTANT:  Vance Peper, PA (present and scrubbed throughout the case, critical for assistance with exposure, retraction, instrumentation, and closure)  ANESTHESIA: spinal  ESTIMATED BLOOD LOSS: 50 mL  FLUIDS REPLACED: 1350 mL of crystalloid  TOURNIQUET TIME: 137 minutes  DRAINS: 2 medium drains to a reinfusion system  SOFT TISSUE RELEASES: Anterior cruciate ligament, posterior cruciate ligament, deep medial collateral ligament, patellofemoral ligament and posterolateral corner  IMPLANTS UTILIZED: DePuy PFC Sigma size 3 posterior stabilized femoral component (cemented), size 4 MBT revision tibial component (cemented), 38 mm 3 peg oval dome patella (cemented), and a 10 mm stabilized rotating platform polyethylene insert.  INDICATIONS FOR SURGERY: Roger Austin is a 60 y.o. year old male with a long history of progressive knee pain. He had previously undergone an opening wedge proximal tibial osteotomy. X-rays demonstrated severe degenerative changes in tricompartmental fashion. The patient had not seen any significant improvement despite conservative nonsurgical intervention. After discussion of the risks and benefits of surgical intervention, the patient expressed understanding of the risks benefits and agree with plans for total knee arthroplasty.   The risks, benefits, and alternatives were discussed at length including but not limited to the risks of infection, bleeding, nerve injury, stiffness, blood  clots, the need for revision surgery, cardiopulmonary complications, among others, and they were willing to proceed.  PROCEDURE IN DETAIL: The patient was brought into the operating room and, after adequate spinal anesthesia was achieved, a tourniquet was placed on the patient's upper thigh. The patient's knee and leg were cleaned and prepped with alcohol and DuraPrep and draped in the usual sterile fashion. A "timeout" was performed as per usual protocol. The lower extremity was exsanguinated using an Esmarch, and the tourniquet was inflated to 300 mmHg. An anterior longitudinal incision was made followed by a standard mid vastus approach. The deep fibers of the medial collateral ligament were elevated in a subperiosteal fashion off of the medial flare of the tibia so as to maintain a continuous soft tissue sleeve. The previously placed plate and 4 screws were identified and the screws were removed without difficulty. The EBI plate was carefully elevated off the cortex. The previous bone graft and straight excellent union. The patella was subluxed laterally and the patellofemoral ligament was incised. Inspection of the knee demonstrated severe degenerative changes with full-thickness loss of articular cartilage. Osteophytes were debrided using a rongeur. Anterior and posterior cruciate ligaments were excised. Two 4.0 mm Schanz pins were inserted in the femur and into the tibia for attachment of the array of trackers used for computer-assisted navigation. Hip center was identified using a circumduction technique. Distal landmarks were mapped using the computer. The distal femur and proximal tibia were mapped using the computer. The distal femoral cutting guide was positioned using computer-assisted navigation so as to achieve a 5 distal valgus cut. The femur was sized and it was felt that a size 3 femoral component was appropriate. A size 3 femoral cutting guide was positioned and the anterior cut was performed  and verified using the computer. This was followed  by completion of the posterior and chamfer cuts. Femoral cutting guide for the central box was then positioned in the center box cut was performed.  Attention was then directed to the proximal tibia. Medial and lateral menisci were excised. The extramedullary tibial cutting guide was positioned using computer-assisted navigation so as to achieve a 0 varus-valgus alignment and 0 posterior slope. The cut was performed and verified using the computer. The proximal tibia was sized and it was felt that a size 4 tibial tray was appropriate. Tibial and femoral trials were inserted followed by insertion of a 10 mm polyethylene insert. The knee was felt to be tight laterally. The trial components were removed and the knee brought into full extension and distracted using the Moreland retractors. The posterolateral corner was carefully released using combination of electrocautery and Metzenbaum scissors. Trial components were re-inserted and the knee was placed through a range of motion. This allowed for excellent mediolateral soft tissue balancing both in flexion and in full extension. Finally, the patella was cut and prepared so as to accommodate a 38 mm 3 peg oval dome patella. A patella trial was placed and the knee was placed through a range of motion with excellent patellar tracking appreciated. The femoral trial was removed after debridement of posterior osteophytes. The central post-hole for the tibial component was reamed so as to accommodate an MBT revision tray followed by insertion of a keel punch. Tibial trials were then removed. Cut surfaces of bone were irrigated with copious amounts of normal saline with antibiotic solution using pulsatile lavage and then suctioned dry. Polymethylmethacrylate cement with gentamicin was prepared in the usual fashion using a vacuum mixer. Cement was applied to the cut surface of the proximal tibia as well as along the  undersurface of a size 3 MBT revision tibial component. Tibial component was positioned and impacted into place. Excess cement was removed using [employer] fast streamer. Cement was then applied to the cut surfaces of the femur as well as along the posterior flanges of the size 3 femoral component. The femoral component was positioned and impacted into place. Excess cement was removed using [employer] fast streamer. A 10 mm polyethylene trial was inserted and the knee was brought into full extension with steady axial compression applied. Finally, cement was applied to the backside of a 38 mm 3 peg oval dome patella and the patellar component was positioned and patellar clamp applied. Excess cement was removed using [employer] fast streamer. After adequate curing of the cement, the tourniquet was deflated after a total tourniquet time of 137 minutes. Hemostasis was achieved using electrocautery. The knee was irrigated with copious amounts of normal saline with antibiotic solution using pulsatile lavage and then suctioned dry. 20 mL of 1.3% Exparel in 40 mL of normal saline was injected along the posterior capsule, medial and lateral gutters, and along the arthrotomy site. A 10 mm stabilized rotating platform polyethylene insert was inserted and the knee was placed through a range of motion with excellent mediolateral soft tissue balancing appreciated and excellent patellar tracking noted. 2 medium drains were placed in the wound bed and brought out through separate stab incisions to be attached to a reinfusion system. The medial parapatellar portion of the incision was reapproximated using interrupted sutures of #1 Vicryl. Subcutaneous tissue was then injected with a total of 30 cc of 0.25% Marcaine with epinephrine. Subcutaneous tissue was approximated in layers using first #0 Vicryl followed #2-0 Vicryl. The skin was approximated with skin staples. A sterile dressing was applied.  The patient tolerated the procedure well and was  transported to the recovery room in stable condition.    Layli Capshaw P. Holley Bouche., M.D.

## 2015-05-05 NOTE — Brief Op Note (Signed)
05/05/2015  3:55 PM  PATIENT:  Roger Austin  60 y.o. male  PRE-OPERATIVE DIAGNOSIS:  Degenerative arthrosis of the left knee Retained hardware to the left proximal tibia (status post proximal tibial osteotomy)  POST-OPERATIVE DIAGNOSIS:  Degenerative arthrosis left knee retained hardware to left proximal tibia  PROCEDURE:  Procedure(s): COMPUTER ASSISTED TOTAL KNEE ARTHROPLASTY (Left) HARDWARE REMOVAL (Left)  SURGEON:  Surgeon(s) and Role:    * Dereck Leep, MD - Primary  ASSISTANTS: Vance Peper, PA   ANESTHESIA:   spinal  EBL:  Total I/O In: 1350 [I.V.:1350] Out: 550 [Urine:500; Blood:50]  BLOOD ADMINISTERED:none  DRAINS: 2 medium drains to a reinfusion system   LOCAL MEDICATIONS USED:  MARCAINE    and OTHER Exparel  SPECIMEN:  No Specimen  DISPOSITION OF SPECIMEN:  N/A  COUNTS:  YES  TOURNIQUET:   Total Tourniquet Time Documented: Thigh (Left) - 137 minutes Total: Thigh (Left) - 137 minutes   DICTATION: .Viviann Spare Dictation  PLAN OF CARE: Admit to inpatient   PATIENT DISPOSITION:  PACU - hemodynamically stable.   Delay start of Pharmacological VTE agent (>24hrs) due to surgical blood loss or risk of bleeding: yes

## 2015-05-05 NOTE — OR Nursing (Signed)
One plate and four screws explanted

## 2015-05-05 NOTE — Progress Notes (Signed)
Autovac changed over to hemovac with 90cc output

## 2015-05-05 NOTE — Progress Notes (Signed)
Pt arrived to floor via stretcher s/p left knee with hardware removal. Autovac in place with 200 ml on arrival to unit at or around 1730. Assessment unremarkable.

## 2015-05-05 NOTE — H&P (Signed)
The patient has been re-examined, and the chart reviewed, and there have been no interval changes to the documented history and physical.    The risks, benefits, and alternatives have been discussed at length. The patient expressed understanding of the risks benefits and agreed with plans for surgical intervention.  Natajah Derderian P. Navneet Schmuck, Jr. M.D.    

## 2015-05-05 NOTE — Anesthesia Procedure Notes (Signed)
Spinal Patient location during procedure: OR Start time: 05/05/2015 11:25 AM Staffing Anesthesiologist: Alvin Critchley Resident/CRNA: Johnna Acosta Performed by: resident/CRNA  Preanesthetic Checklist Completed: patient identified, site marked, surgical consent, pre-op evaluation, timeout performed, IV checked, risks and benefits discussed and monitors and equipment checked Spinal Block Patient position: sitting Prep: Betadine Patient monitoring: continuous pulse ox and blood pressure Approach: midline Location: L3-4 Injection technique: single-shot Needle Needle type: Whitacre  Needle gauge: 24 G Needle length: 5 cm Assessment Sensory level: T6

## 2015-05-05 NOTE — Anesthesia Preprocedure Evaluation (Addendum)
Anesthesia Evaluation  Patient identified by MRN, date of birth, ID band Patient awake    Reviewed: Allergy & Precautions, NPO status , Patient's Chart, lab work & pertinent test results  Airway Mallampati: IV  TM Distance: <3 FB Neck ROM: Full    Dental  (+) Chipped   Pulmonary    Pulmonary exam normal breath sounds clear to auscultation       Cardiovascular Normal cardiovascular exam+ pacemaker + Valvular Problems/Murmurs   [employer] pacemaker... Hx of sick sinus syndrome   Neuro/Psych    GI/Hepatic negative GI ROS, Neg liver ROS,   Endo/Other  negative endocrine ROS  Renal/GU negative Renal ROS  negative genitourinary   Musculoskeletal negative musculoskeletal ROS (+)   Abdominal Normal abdominal exam  (+)   Peds negative pediatric ROS (+)  Hematology negative hematology ROS (+)   Anesthesia Other Findings Psoriatic arthritis  Reproductive/Obstetrics                            Anesthesia Physical Anesthesia Plan  ASA: III  Anesthesia Plan: Spinal   Post-op Pain Management:    Induction:   Airway Management Planned: Nasal Cannula  Additional Equipment:   Intra-op Plan:   Post-operative Plan:   Informed Consent: I have reviewed the patients History and Physical, chart, labs and discussed the procedure including the risks, benefits and alternatives for the proposed anesthesia with the patient or authorized representative who has indicated his/her understanding and acceptance.   Dental advisory given  Plan Discussed with: CRNA and Surgeon  Anesthesia Plan Comments:         Anesthesia Quick Evaluation

## 2015-05-05 NOTE — Transfer of Care (Signed)
Immediate Anesthesia Transfer of Care Note  Patient: Roger Austin  Procedure(s) Performed: Procedure(s): COMPUTER ASSISTED TOTAL KNEE ARTHROPLASTY (Left) HARDWARE REMOVAL (Left)  Patient Location: PACU  Anesthesia Type:GA combined with regional for post-op pain  Level of Consciousness: awake and patient cooperative  Airway & Oxygen Therapy: Patient Spontanous Breathing  Post-op Assessment: Report given to RN  Post vital signs: Reviewed and stable  Last Vitals:  Filed Vitals:   05/05/15 1006 05/05/15 1553  BP: 146/77 117/69  Pulse: 59 60  Temp: 36.4 C 97.73F  Resp: 16 12    Complications: No apparent anesthesia complications

## 2015-05-05 NOTE — Progress Notes (Signed)
Pts. First autovac completed. VSS. IS at the bedside and pt. Instructed on its use. Pt. Dangled and did excellent. Numbness gone and pt. Able to do leg lifts. Neurochecks WDL.

## 2015-05-06 ENCOUNTER — Encounter: Payer: Self-pay | Admitting: Orthopedic Surgery

## 2015-05-06 LAB — BASIC METABOLIC PANEL
ANION GAP: 4 — AB (ref 5–15)
BUN: 26 mg/dL — AB (ref 6–20)
CO2: 31 mmol/L (ref 22–32)
Calcium: 8.3 mg/dL — ABNORMAL LOW (ref 8.9–10.3)
Chloride: 102 mmol/L (ref 101–111)
Creatinine, Ser: 1.08 mg/dL (ref 0.61–1.24)
GFR calc Af Amer: >60 mL/min (ref >60)
Glucose, Bld: 92 mg/dL (ref 65–99)
POTASSIUM: 4.1 mmol/L (ref 3.5–5.1)
SODIUM: 137 mmol/L (ref 135–145)

## 2015-05-06 LAB — CBC
HCT: 38.8 % — ABNORMAL LOW (ref 40.0–52.0)
Hemoglobin: 13.3 g/dL (ref 13.0–18.0)
MCH: 31.9 pg (ref 26.0–34.0)
MCHC: 34.2 g/dL (ref 32.0–36.0)
MCV: 93.4 fL (ref 80.0–100.0)
PLATELETS: 164 10*3/uL (ref 150–440)
RBC: 4.16 MIL/uL — AB (ref 4.40–5.90)
RDW: 14.1 % (ref 11.5–14.5)
WBC: 10.5 10*3/uL (ref 3.8–10.6)

## 2015-05-06 MED ORDER — OXYCODONE HCL 5 MG PO TABS: 5.0000 mg | ORAL_TABLET | ORAL | Status: DC | PRN

## 2015-05-06 MED ORDER — ENOXAPARIN SODIUM 40 MG/0.4ML ~~LOC~~ SOLN: 40.0000 mg | SUBCUTANEOUS | Status: DC

## 2015-05-06 MED ORDER — TRAMADOL HCL 50 MG PO TABS: 50.0000 mg | ORAL_TABLET | ORAL | Status: DC | PRN

## 2015-05-06 NOTE — Discharge Instructions (Signed)

## 2015-05-06 NOTE — Progress Notes (Signed)
Physical Therapy Treatment Patient Details Name: Roger Austin MRN: [id number] DOB: 10/09/1955 Today's Date: 05/06/2015    History of Present Illness admitted for acute hospitalization status post L TKR (with hardware removal from previous L tibial osteotomy), 05/05/15, WBAT.    PT Comments    Pt increased ambulation distance from 35 to 140 feet.  Tomorrow pt's plan should be to navigate stairs and ambulate at least one lap around the nursing station.  Follow Up Recommendations  Home health PT     Equipment Recommendations       Recommendations for Other Services       Precautions / Restrictions Precautions Precautions: Fall Restrictions Weight Bearing Restrictions: No    Mobility  Bed Mobility Overal bed mobility: Modified Independent                Transfers Overall transfer level: Needs assistance Equipment used: Rolling walker (2 wheeled) Transfers: Sit to/from Stand Sit to Stand: Supervision            Ambulation/Gait Ambulation/Gait assistance: Min guard Ambulation Distance (Feet): 140 Feet Assistive device: Rolling walker (2 wheeled) Gait Pattern/deviations: Step-through pattern     General Gait Details: reciprocal stepping with fair/good stance time and weight acceptance to L LE.  good quad control with no buckling or instability noted.   Stairs            Wheelchair Mobility    Modified Rankin (Stroke Patients Only)       Balance Overall balance assessment: Needs assistance Sitting-balance support: Feet supported Sitting balance-Leahy Scale: Good     Standing balance support: Bilateral upper extremity supported (RW) Standing balance-Leahy Scale: Good                      Cognition Arousal/Alertness: Awake/alert Behavior During Therapy: WFL for tasks assessed/performed Overall Cognitive Status: Within Functional Limits for tasks assessed                      Exercises Total Joint Exercises Ankle  Circles/Pumps: AROM;Both;20 reps Gluteal Sets: AROM;Both;10 reps Heel Slides: AROM;Both;10 reps Hip ABduction/ADduction: AROM;Both;20 reps Straight Leg Raises: AROM;Both;10 reps Long Arc Quad: AROM;Both;20 reps General Exercises - Lower Extremity Hip Flexion/Marching: AROM;Both;20 reps (sitting EOB)    General Comments   Nursing was contacted and cleared pt for physical therapy.  Pt was agreeable and tolerated session well. Pt's friend was present during treatment session.      Pertinent Vitals/Pain Pain Assessment: 0-10 Pain Score: 4  Pain Location: L knee Pain Descriptors / Indicators: Aching;Operative site guarding Pain Intervention(s): Limited activity within patient's tolerance;Monitored during session;RN gave pain meds during session;Repositioned;Ice applied  See flow sheet for vitals.     Home Living Family/patient expects to be discharged to:: Private residence Living Arrangements: Spouse/significant other Available Help at Discharge: Family Type of Home: House Home Access: Stairs to enter Entrance Stairs-Rails: Right Home Layout: One level Home Equipment: Environmental consultant - 2 wheels;Shower seat;Bedside commode;Crutches      Prior Function Level of Independence: Independent      Comments: works full time at [employer].   PT Goals (current goals can now be found in the care plan section) Acute Rehab PT Goals Patient Stated Goal: go home PT Goal Formulation: With patient Time For Goal Achievement: 05/20/15 Potential to Achieve Goals: Good Progress towards PT goals: Progressing toward goals    Frequency  BID    PT Plan Current plan remains appropriate    Co-evaluation  End of Session Equipment Utilized During Treatment: Gait belt Activity Tolerance: Patient tolerated treatment well Patient left: in bed;with call bell/phone within reach;with bed alarm set;with family/visitor present;with SCD's reapplied (Bilateral Towel Rolls under heels, polarcare  actovated)     Time: [id number] PT Time Calculation (min) (ACUTE ONLY): 23 min  Charges:                       G Codes:      Mittie Bodo, SPT Mittie Bodo 05/06/2015, 3:27 PM

## 2015-05-06 NOTE — Evaluation (Signed)
Occupational Therapy Evaluation Patient Details Name: Roger Austin MRN: [id number] DOB: 10-08-1955 Today's Date: 05/06/2015    History of Present Illness admitted for acute hospitalization status post L TKR (with hardware removal from previous L tibial osteotomy), 05/05/15, WBAT.   Clinical Impression   This patient is a 60 year old male who came to [facility] for a L total knee replacement.  Patient lives with his wife in a one story home with 2 steps to enter.  He had been independent with ADL and functional mobility and works full time. He now has deficits minimal activities of daily living and mobility with pain. He demonstrates ability to dress self with out assistive devices but did like the reacher and sock aid so gave written list of vendors that carry hip kit.      Follow Up Recommendations   (Home with home health Physical Therapy only, no further Occupational Therapy needed.)    Equipment Recommendations       Recommendations for Other Services       Precautions / Restrictions Precautions Precautions: Fall Restrictions Weight Bearing Restrictions: No      Mobility Bed Mobility Overal bed mobility: Modified Independent                Transfers              Balance                                  ADL                                         General ADL Comments: Had been independent and working full time.  He demonstrates ability to dress self with out assistive devices but did like the reacher and sock aid so gave written list of vendors that carry hip kit.     Vision     Perception     Praxis      Pertinent Vitals/Pain Pain Assessment: 0-10 Pain Score: 4  Pain Location: L knee Pain Descriptors / Indicators: Aching Pain Intervention(s): Limited activity within patient's tolerance;Monitored during session;Repositioned     Hand Dominance     Extremity/Trunk Assessment Upper  Extremity Assessment Upper Extremity Assessment: Overall WFL for tasks assessed   Lower Extremity Assessment Lower Extremity Assessment: Defer to PT evaluation       Communication Communication Communication: No difficulties   Cognition Arousal/Alertness: Awake/alert Behavior During Therapy: WFL for tasks assessed/performed Overall Cognitive Status: Within Functional Limits for tasks assessed                     General Comments       Exercises     Shoulder Instructions      Home Living Family/patient expects to be discharged to:: Private residence Living Arrangements: Spouse/significant other Available Help at Discharge: Family Type of Home: House Home Access: Stairs to enter Technical brewer of Steps: 2 Entrance Stairs-Rails: Right Home Layout: One level               Home Equipment: Environmental consultant - 2 wheels;Shower seat;Bedside commode;Crutches          Prior Functioning/Environment Level of Independence: Independent        Comments: works full time at [employer].    OT Diagnosis: Acute  pain   OT Problem List:     OT Treatment/Interventions:      OT Goals(Current goals can be found in the care plan section) Acute Rehab OT Goals Patient Stated Goal: go home  OT Frequency:     Barriers to D/C:            Co-evaluation              End of Session Equipment Utilized During Treatment:  (Hip kit)  Activity Tolerance:   Patient left: in chair;with family/visitor present   Time: 1140-1157 OT Time Calculation (min): 17 min Charges:  OT General Charges $OT Visit: 1 Procedure OT Evaluation $OT Eval Low Complexity: 1 Procedure G-Codes:    Myrene Galas, MS/OTR/L  05/06/2015, 12:05 PM

## 2015-05-06 NOTE — Anesthesia Postprocedure Evaluation (Signed)
Anesthesia Post Note  Patient: Roger Austin  Procedure(s) Performed: Procedure(s) (LRB): COMPUTER ASSISTED TOTAL KNEE ARTHROPLASTY (Left) HARDWARE REMOVAL (Left)  Patient location during evaluation: Other Anesthesia Type: Spinal Level of consciousness: awake, awake and alert and oriented Pain management: satisfactory to patient Vital Signs Assessment: post-procedure vital signs reviewed and stable Respiratory status: respiratory function stable and spontaneous breathing Cardiovascular status: stable Postop Assessment: no headache, no backache, patient able to bend at knees, no signs of nausea or vomiting and adequate PO intake Anesthetic complications: no    Last Vitals:  Filed Vitals:   05/06/15 0008 05/06/15 0459  BP: 96/62 105/63  Pulse:  59  Temp:  36.7 C  Resp:  18    Last Pain:  Filed Vitals:   05/06/15 0636  PainSc: 2                  Silvana Newness A

## 2015-05-06 NOTE — Progress Notes (Signed)
   Subjective: 1 Day Post-Op Procedure(s) (LRB): COMPUTER ASSISTED TOTAL KNEE ARTHROPLASTY (Left) HARDWARE REMOVAL (Left) Patient reports pain as 2 on 0-10 scale.   Patient is well, and has had no acute complaints or problems We will start therapy today.  Plan is to go Home after hospital stay. no nausea and no vomiting Patient denies any chest pains or shortness of breath. Objective: Vital signs in last 24 hours: Temp:  [94.8 F (34.9 C)-98.2 F (36.8 C)] 98 F (36.7 C) (02/23 0459) Pulse Rate:  [59-60] 59 (02/23 0459) Resp:  [6-18] 18 (02/23 0459) BP: (78-146)/(54-87) 105/63 mmHg (02/23 0459) SpO2:  [94 %-100 %] 94 % (02/23 0459) Weight:  [95.255 kg (210 lb)] 95.255 kg (210 lb) (02/22 1006) Unable to evaluate the wound today 2/2 original dressing in place Heels are non tender and elevated off the bed using rolled towels Intake/Output from previous day: 02/22 0701 - 02/23 0700 In: 4653 [I.V.:2705; OI:[id number]; IV Piggyback:350] Out: [id number] [Urine:3275; Drains:558; Blood:50] Intake/Output this shift:     Recent Labs  05/06/15 0651  HGB 13.3    Recent Labs  05/06/15 0651  WBC 10.5  RBC 4.16*  HCT 38.8*  PLT 164    Recent Labs  05/06/15 0651  NA 137  K 4.1  CL 102  CO2 31  BUN 26*  CREATININE 1.08  GLUCOSE 92  CALCIUM 8.3*   No results for input(s): LABPT, INR in the last 72 hours.  EXAM General - Patient is Alert, Appropriate and Oriented Extremity - Neurologically intact Neurovascular intact Sensation intact distally Intact pulses distally Dorsiflexion/Plantar flexion intact Dressing - dressing C/D/I Motor Function - intact, moving foot and toes well on exam.    Past Medical History  Diagnosis Date  . H/O knee surgery 2004    plate and strap inserted.  to be removed for surgery with tkr  . Hyperlipidemia   . Presence of permanent cardiac pacemaker   . Heart murmur   . Anxiety     Assessment/Plan: 1 Day Post-Op Procedure(s)  (LRB): COMPUTER ASSISTED TOTAL KNEE ARTHROPLASTY (Left) HARDWARE REMOVAL (Left) Active Problems:   S/P total knee arthroplasty  Estimated body mass index is 28.47 kg/(m^2) as calculated from the following:   Height as of this encounter: 6' (1.829 m).   Weight as of this encounter: 95.255 kg (210 lb). Advance diet Up with therapy D/C IV fluids Plan for discharge tomorrow Discharge home with home health  Labs: reviewed DVT Prophylaxis - Lovenox, Foot Pumps and TED hose Weight-Bearing as tolerated to right leg D/C O2 and Pulse OX and try on Room Air Labs in am Begin working on bowel movement  Jon R. [place] 05/06/2015, 7:38 AM

## 2015-05-06 NOTE — Evaluation (Signed)
Physical Therapy Evaluation Patient Details Name: Roger Austin MRN: [id number] DOB: 02-22-1956 Today's Date: 05/06/2015   History of Present Illness  admitted for acute hospitalization status post L TKR (with hardware removal from previous L tibial osteotomy), 05/05/15, WBAT.  Clinical Impression  Upon evaluation, patient alert and oriented; follows all commands and demonstrates good safety awareness/insight.  L LE with good post-op strength and control (at least 3/5 throughout), ROM 2-90 degrees, pain at 4/10.  Good quad activation and control; no lag noted with SLR (no KI required at this time).  Able to complete bed mobility indep; sit/stand, basic transfers and gait (35') with RW, close sup.  Good weight acceptance/stance time to L LE with minimal/no increase in pain reported.  Anticipate consistent progression with all therex and mobility throughout remaining hospitalization. Would benefit from skilled PT to address above deficits and promote optimal return to PLOF; Recommend transition to [facility] upon discharge from acute hospitalization, but anticipate potential progression to outpatient PT.     Follow Up Recommendations Home health PT (may progress to outpatient PT)    Equipment Recommendations   (brought personal RW--noted in good condition, at appropriate height)    Recommendations for Other Services       Precautions / Restrictions Precautions Precautions: Fall Restrictions Weight Bearing Restrictions: No      Mobility  Bed Mobility Overal bed mobility: Modified Independent                Transfers Overall transfer level: Needs assistance Equipment used: Rolling walker (2 wheeled) Transfers: Sit to/from Stand Sit to Stand: Supervision            Ambulation/Gait Ambulation/Gait assistance: Supervision Ambulation Distance (Feet): 35 Feet Assistive device: Rolling walker (2 wheeled)       General Gait Details: reciprocal stepping with fair/good  stance time and weight acceptance to L LE.  good quad control with no buckling or instability noted.  Stairs            Wheelchair Mobility    Modified Rankin (Stroke Patients Only)       Balance Overall balance assessment: Needs assistance Sitting-balance support: No upper extremity supported;Feet supported Sitting balance-Leahy Scale: Good     Standing balance support: Bilateral upper extremity supported Standing balance-Leahy Scale: Good                               Pertinent Vitals/Pain Pain Assessment: 0-10 Pain Score: 4  Pain Location: L knee Pain Descriptors / Indicators: Aching Pain Intervention(s): Limited activity within patient's tolerance;Monitored during session;Repositioned    Home Living Family/patient expects to be discharged to:: Private residence Living Arrangements: Spouse/significant other Available Help at Discharge: Family Type of Home: House Home Access: Stairs to enter Entrance Stairs-Rails: Right   Home Layout: One level Home Equipment: Environmental consultant - 2 wheels;Shower seat;Bedside commode;Crutches      Prior Function Level of Independence: Independent         Comments: Indep with household and community mobility without assist device; + driving; working full-time at [employer]        Extremity/Trunk Assessment   Upper Extremity Assessment: Overall WFL for tasks assessed           Lower Extremity Assessment: Overall WFL for tasks assessed (L knee grossly 3/5, good isolated strength and control post-op)         Communication   Communication: No difficulties  Cognition Arousal/Alertness: Awake/alert Behavior During Therapy: WFL for tasks assessed/performed Overall Cognitive Status: Within Functional Limits for tasks assessed                      General Comments General comments (skin integrity, edema, etc.): L hemovac, surgical dressing    Exercises Total Joint  Exercises Goniometric ROM: L knee ROM: 2 degrees from neutral extension (supine), to 90 degrees flexion (short-sitting edge of bed) Other Exercises Other Exercises: Supine LE therex, 1x10, AROM for muscular strength/endurance: ankle pumps, quad sets, heel slides, SLR, LAQs.  Good quad activation/control; no need for KI at this time.      Assessment/Plan    PT Assessment Patient needs continued PT services  PT Diagnosis Difficulty walking;Generalized weakness;Acute pain   PT Problem List Decreased strength;Decreased range of motion;Decreased activity tolerance;Decreased balance;Decreased mobility;Decreased knowledge of use of DME;Decreased safety awareness;Decreased knowledge of precautions;Decreased skin integrity;Pain  PT Treatment Interventions DME instruction;Gait training;Functional mobility training;Stair training;Therapeutic activities;Therapeutic exercise;Balance training;Patient/family education   PT Goals (Current goals can be found in the Care Plan section) Acute Rehab PT Goals Patient Stated Goal: "to get up out of this bed" PT Goal Formulation: With patient Time For Goal Achievement: 05/20/15 Potential to Achieve Goals: Good    Frequency BID   Barriers to discharge        Co-evaluation               End of Session Equipment Utilized During Treatment: Gait belt Activity Tolerance: Patient tolerated treatment well Patient left: in chair;with call bell/phone within reach;with chair alarm set Nurse Communication: Mobility status         Time: [id number] PT Time Calculation (min) (ACUTE ONLY): 24 min   Charges:   PT Evaluation $PT Eval Low Complexity: 1 Procedure PT Treatments $Therapeutic Exercise: 8-22 mins   PT G Codes:        Nysha Koplin H. Owens Shark, PT, DPT, NCS 05/06/2015, 9:47 AM [phone]

## 2015-05-06 NOTE — Care Management Note (Signed)
Case Management Note  Patient Details  Name: EIVIN RACHOW MRN: [id number] Date of Birth: 10-04-1955  Subjective/Objective:     60yo Mr Auguste Raponi received a left TKA on 05/05/15 by Dr Marry Guan. He rsides at home with his wife. PCP=Dr Hande. Pharmacy=[facility]. He has a rolling walker and a BSC at home. Wife will provide transportation to appointments. Discussed home health providers and a referral was called to Corliss Blacker at [facility] for home health PT. Lovenox 40mg  SQ #14 was called to [facility]. Case Management will follow for discharge planning.               Action/Plan:   Expected Discharge Date:                  Expected Discharge Plan:     In-House Referral:     Discharge planning Services     Post Acute Care Choice:    Choice offered to:     DME Arranged:    DME Agency:     HH Arranged:    [place] Agency:     Status of Service:     Medicare Important Message Given:    Date Medicare IM Given:    Medicare IM give by:    Date Additional Medicare IM Given:    Additional Medicare Important Message give by:     If discussed at [place] of Stay Meetings, dates discussed:    Additional Comments:  Hezekiah Veltre A, RN 05/06/2015, 10:46 AM

## 2015-05-06 NOTE — Progress Notes (Signed)
Clinical Social Worker (CSW) received SNF consult. PT is recommending home health. RN Case Manager is aware of above. Please reconsult if future social work needs arise. CSW signing off.   Eller Sweis Morgan, LCSW [phone] 

## 2015-05-06 NOTE — Progress Notes (Signed)
Foley d/c'd with 100cc output at 0535

## 2015-05-07 LAB — CBC
HEMATOCRIT: 36.4 % — AB (ref 40.0–52.0)
HEMOGLOBIN: 12.4 g/dL — AB (ref 13.0–18.0)
MCH: 32.2 pg (ref 26.0–34.0)
MCHC: 34.1 g/dL (ref 32.0–36.0)
MCV: 94.3 fL (ref 80.0–100.0)
PLATELETS: 150 10*3/uL (ref 150–440)
RBC: 3.86 MIL/uL — AB (ref 4.40–5.90)
RDW: 13.8 % (ref 11.5–14.5)
WBC: 7.1 10*3/uL (ref 3.8–10.6)

## 2015-05-07 MED ORDER — LACTULOSE 10 GM/15ML PO SOLN: 10.0000 g | Freq: Two times a day (BID) | ORAL | Status: DC | PRN

## 2015-05-07 NOTE — Discharge Summary (Signed)
Physician Discharge Summary  Patient ID: Roger Austin MRN: [id number] DOB/AGE: 10/25/1955 60 y.o.  Admit date: 05/05/2015 Discharge date: 05/07/2015  Admission Diagnoses:  osteoarthritis   Discharge Diagnoses: Patient Active Problem List   Diagnosis Date Noted  . S/P total knee arthroplasty 05/05/2015    Past Medical History  Diagnosis Date  . H/O knee surgery 2004    plate and strap inserted.  to be removed for surgery with tkr  . Hyperlipidemia   . Presence of permanent cardiac pacemaker   . Heart murmur   . Anxiety      Transfusion: Autovac transfusion given first 6 hours postoperatively   Consultants (if any):   case management for home health assistance  Discharged Condition: Improved  Hospital Course: Roger Austin is an 60 y.o. male who was admitted 05/05/2015 with a diagnosis of degenerative arthrosis left knee and went to the operating room on 05/05/2015 and underwent the above named procedures.    Surgeries:Procedure(s): COMPUTER ASSISTED TOTAL KNEE ARTHROPLASTY HARDWARE REMOVAL on 05/05/2015  PRE-OPERATIVE DIAGNOSIS: Degenerative arthrosis of the left knee, primary Retained hardware to the proximal tibia (status post proximal tibial osteotomy)  POST-OPERATIVE DIAGNOSIS: Same  PROCEDURE: Left total knee arthroplasty using computer-assisted navigation Hardware removal from the right tibia (4 screws and one plate)  SURGEON: Marciano Sequin. M.D.  ASSISTANT: Vance Peper, PA (present and scrubbed throughout the case, critical for assistance with exposure, retraction, instrumentation, and closure)  ANESTHESIA: spinal  ESTIMATED BLOOD LOSS: 50 mL  FLUIDS REPLACED: 1350 mL of crystalloid  TOURNIQUET TIME: 137 minutes  DRAINS: 2 medium drains to a reinfusion system  SOFT TISSUE RELEASES: Anterior cruciate ligament, posterior cruciate ligament, deep medial collateral ligament, patellofemoral ligament and posterolateral corner  IMPLANTS  UTILIZED: DePuy PFC Sigma size 3 posterior stabilized femoral component (cemented), size 4 MBT revision tibial component (cemented), 38 mm 3 peg oval dome patella (cemented), and a 10 mm stabilized rotating platform polyethylene insert.  INDICATIONS FOR SURGERY: Roger Austin is a 60 y.o. year old male with a long history of progressive knee pain. He had previously undergone an opening wedge proximal tibial osteotomy. X-rays demonstrated severe degenerative changes in tricompartmental fashion. The patient had not seen any significant improvement despite conservative nonsurgical intervention. After discussion of the risks and benefits of surgical intervention, the patient expressed understanding of the risks benefits and agree with plans for total knee arthroplasty.   The risks, benefits, and alternatives were discussed at length including but not limited to the risks of infection, bleeding, nerve injury, stiffness, blood clots, the need for revision surgery, cardiopulmonary complications, among others, and they were willing to proceed. Patient tolerated the surgery well. No complications .Patient was taken to PACU where she was stabilized and then transferred to the orthopedic floor.  Patient started on Lovenox 30 q 12 hrs. Foot pumps applied bilaterally at 80 mm hg. Heels elevated off bed with rolled towels. No evidence of DVT. Calves non tender. Negative Homan. Physical therapy started on day #1 for gait training and transfer with OT starting on  day #1 for ADL and assisted devices. Patient has done well with therapy. Ambulated greater than 200 feet upon being discharged. Was able to go up 4 steps and down without any difficulty.  Patient's IV and Foley were discontinued on day #1 with Hemovac being  discontinued on day #2. Dressing was changed on day #2 as well.   Anti-infectives    Start     Dose/Rate Route Frequency  Ordered Stop   05/05/15 1745  clindamycin (CLEOCIN) IVPB 600 mg     600  mg 100 mL/hr over 30 Minutes Intravenous Every 6 hours 05/05/15 1706 05/06/15 1412   05/05/15 1000  vancomycin (VANCOCIN) IVPB 1000 mg/200 mL premix     1,000 mg 200 mL/hr over 60 Minutes Intravenous  Once 05/05/15 0952 05/05/15 1144   05/05/15 1000  clindamycin (CLEOCIN) IVPB 900 mg  Status:  Discontinued     900 mg 100 mL/hr over 30 Minutes Intravenous  Once 05/05/15 0952 05/05/15 1705   05/05/15 0926  clindamycin (CLEOCIN) 900 MG/50ML IVPB    Comments:  Hallaji, Violet Ann: cabinet override      05/05/15 0926 05/05/15 1136    .  He was fitted with AV 1 compression foot pump devices bilaterally, instructed on heel pumps, early ambulation and fitted with TED stockings bilaterally for DVT prophylaxis.  He benefited maximally from the hospital stay and there were no complications.    Recent vital signs:  Filed Vitals:   05/06/15 2019 05/07/15 0336  BP: 115/61 117/74  Pulse: 54 60  Temp: 98 F (36.7 C) 97.4 F (36.3 C)  Resp: 18 18    Recent laboratory studies:  Lab Results  Component Value Date   HGB 12.4* 05/07/2015   HGB 13.3 05/06/2015   HGB 14.9 04/27/2015   HGB 14.9 04/27/2015   Lab Results  Component Value Date   WBC 7.1 05/07/2015   PLT 150 05/07/2015   Lab Results  Component Value Date   INR 1.04 04/27/2015   Lab Results  Component Value Date   NA 137 05/06/2015   K 4.1 05/06/2015   CL 102 05/06/2015   CO2 31 05/06/2015   BUN 26* 05/06/2015   CREATININE 1.08 05/06/2015   GLUCOSE 92 05/06/2015    Discharge Medications:     Medication List    TAKE these medications        allopurinol 100 MG tablet  Commonly known as:  ZYLOPRIM  Take 100 mg by mouth daily.     allopurinol 300 MG tablet  Commonly known as:  ZYLOPRIM  Take 300 mg by mouth daily.     aspirin 81 MG tablet  Take 81 mg by mouth daily.     celecoxib 200 MG capsule  Commonly known as:  CELEBREX  Take 200 mg by mouth daily.     enoxaparin 40 MG/0.4ML injection  Commonly known  as:  LOVENOX  Inject 0.4 mLs (40 mg total) into the skin daily.     ezetimibe 10 MG tablet  Commonly known as:  ZETIA  Take 10 mg by mouth daily.     gabapentin 300 MG capsule  Commonly known as:  NEURONTIN  Take 300 mg by mouth 4 (four) times daily.     lisinopril 5 MG tablet  Commonly known as:  PRINIVIL,ZESTRIL  Take 5 mg by mouth daily.     oxyCODONE 5 MG immediate release tablet  Commonly known as:  Oxy IR/ROXICODONE  Take 1-2 tablets (5-10 mg total) by mouth every 4 (four) hours as needed for severe pain or breakthrough pain.     sertraline 100 MG tablet  Commonly known as:  ZOLOFT  Take 100 mg by mouth daily.     traMADol 50 MG tablet  Commonly known as:  ULTRAM  Take 1-2 tablets (50-100 mg total) by mouth every 4 (four) hours as needed for moderate pain.        Diagnostic  Studies: Ct Chest W Contrast  04/16/2015  CLINICAL DATA:  Sternoclavicular joint pain. Trauma to the sternoclavicular joint. EXAM: CT CHEST WITH CONTRAST TECHNIQUE: Multidetector CT imaging of the chest was performed during intravenous contrast administration. CONTRAST:  52mL OMNIPAQUE IOHEXOL 350 MG/ML SOLN COMPARISON:  CTA of the chest 03/22/2014. FINDINGS: There is an effusion within the right sternoclavicular joint with erosive changes in the distal clavicle. Mild inflammatory changes are now evident about the joint as well. The sternum is otherwise unremarkable. Pacing wires are in place. The heart size is normal. There is no significant pleural or pericardial effusion. There is diffuse fatty infiltration liver. No discrete lesion is present. Limited imaging of the upper abdomen is otherwise unremarkable. Atherosclerotic changes are present in the descending thoracic and abdominal aorta. The lungs are clear bilaterally. No nodule, mass, or airspace disease is evident. Multilevel degenerative changes are again noted within the thoracic spine with endplate Schmorl's nodes. Vertebral body heights alignment  are otherwise normal. IMPRESSION: 1. New effusion and erosive changes within the right sternoclavicular joint suggesting inflammation. 2. No acute or healing fracture. 3. Stable multilevel degenerative changes within the thoracolumbar spine. Electronically Signed   By: San Morelle M.D.   On: 04/16/2015 15:44   Dg Knee Left Port  05/05/2015  CLINICAL DATA:  Postop left knee pain. EXAM: PORTABLE LEFT KNEE - 1-2 VIEW COMPARISON:  None. FINDINGS: 1613 hours. Two view portable study of the left knee shows the patient to be status post total knee replacement. No evidence for immediate hardware complications. Surgical drains are seen over the anterior soft tissues. IMPRESSION: Status post tricompartmental left knee replacement without evidence for immediate complicating features. Electronically Signed   By: Misty Stanley M.D.   On: 05/05/2015 16:33    Disposition:       Discharge Instructions    Diet - low sodium heart healthy    Complete by:  As directed      Diet - low sodium heart healthy    Complete by:  As directed      Increase activity slowly    Complete by:  As directed      Increase activity slowly    Complete by:  As directed            Follow-up Information    Follow up with [facility] On 05/20/2015.   Specialty:  Physician Assistant   Why:  AT 10:15AM   Contact information:   [place] [address] [phone]       Follow up with Dereck Leep, MD On 06/17/2015.   Specialty:  Orthopedic Surgery   Why:  AT 10:45AM   Contact information:   [place] [address] [phone]        Signed: Watt Climes. 05/07/2015, 7:30 AM

## 2015-05-07 NOTE — Progress Notes (Signed)
DISCHARGE NOTE:  Pt given discharge instructions and prescriptions. Pt verbalized understanding, pt wheeled to car by staff.

## 2015-05-07 NOTE — Progress Notes (Signed)
Physical Therapy Treatment Patient Details Name: Roger Austin MRN: [id number] DOB: 1956/01/22 Today's Date: 05/07/2015    History of Present Illness admitted for acute hospitalization status post L TKR (with hardware removal from previous L tibial osteotomy), 05/05/15, WBAT.    PT Comments    Patient with excellent progress towards all therapy goals; able to complete all mobility (bed mobiltiy, sit/stand, basic transfers, gait and stairs) at mod indep level this date.  Good pain control, good L knee strength/stability; ROM 0-95 degrees. Patient safe for discharge home from therapy perspective; no questions/concerns about upcoming discharge and household mobility/activities.  Follow Up Recommendations  Home health PT     Equipment Recommendations       Recommendations for Other Services       Precautions / Restrictions Precautions Precautions: Fall Precaution Comments: PPM, contact isolation Restrictions Weight Bearing Restrictions: No    Mobility  Bed Mobility Overal bed mobility: Modified Independent                Transfers Overall transfer level: Modified independent Equipment used: Rolling walker (2 wheeled) Transfers: Sit to/from Stand              Ambulation/Gait Ambulation/Gait assistance: Modified independent (Device/Increase time) Ambulation Distance (Feet): 250 Feet Assistive device: Rolling walker (2 wheeled)   Gait velocity: 10' walk time, 5 seconds   General Gait Details: reciprocal stepping with nearly symmetrical stance time, step height/length; good cadence and overall gait speed.  Good L LE heel strike and toe off with good knee control througout gait cycle.   Stairs Stairs: Yes Stairs assistance: Modified independent (Device/Increase time) Stair Management: Two rails;Step to pattern Number of Stairs: 4 General stair comments: step to pattern with good, indep demonstration of correct technique  Wheelchair Mobility     Modified Rankin (Stroke Patients Only)       Balance Overall balance assessment: Needs assistance Sitting-balance support: No upper extremity supported;Feet supported Sitting balance-Leahy Scale: Normal     Standing balance support: Bilateral upper extremity supported;No upper extremity supported Standing balance-Leahy Scale: Good                      Cognition Arousal/Alertness: Awake/alert Behavior During Therapy: WFL for tasks assessed/performed Overall Cognitive Status: Within Functional Limits for tasks assessed                      Exercises Total Joint Exercises Goniometric ROM: L knee: 0-95 degrees; good fluidity, minimal pain. Other Exercises Other Exercises: Standing LE therex, 1x15, AROM for muscular strength/endurance: heel raises, mini squats (emphasis on symmetrical WBing bilat LEs), marching, hip flex/ext/abduct/adduct.  Good knee control in periods of modified SLS with minimal increase in pain.    General Comments        Pertinent Vitals/Pain Pain Assessment: 0-10 Pain Score: 4  Pain Location: L knee Pain Descriptors / Indicators: Aching Pain Intervention(s): Limited activity within patient's tolerance;Monitored during session;Repositioned;Premedicated before session    Home Living                      Prior Function            PT Goals (current goals can now be found in the care plan section) Acute Rehab PT Goals Patient Stated Goal: go home PT Goal Formulation: With patient Time For Goal Achievement: 05/20/15 Potential to Achieve Goals: Good Progress towards PT goals: Progressing toward goals (patient met all acute care PT goals  at this time; okay for discharge home this date fro therapy perspective)    Frequency  BID    PT Plan Current plan remains appropriate    Co-evaluation             End of Session Equipment Utilized During Treatment: Gait belt Activity Tolerance: Patient tolerated treatment  well Patient left: in chair;with call bell/phone within reach;with chair alarm set     Time: 3419-6222 PT Time Calculation (min) (ACUTE ONLY): 26 min  Charges:  $Gait Training: 8-22 mins $Therapeutic Exercise: 8-22 mins                    G Codes:      Jerren Flinchbaugh H. Owens Shark, PT, DPT, NCS 05/07/2015, 10:13 AM [phone]

## 2015-05-07 NOTE — Progress Notes (Signed)
Physical Therapy Treatment Patient Details Name: Roger Austin MRN: [id number] DOB: [birth year] Today's Date: 05/07/2015    History of Present Illness admitted for acute hospitalization status post L TKR (with hardware removal from previous L tibial osteotomy), 05/05/15, WBAT.    PT Comments    All therapy goals met at this time; mod indep with RW for basic, household mobility.  Good safety awareness/insight; anticipate quick transition to outpatient PT at discharge.   Follow Up Recommendations  Home health PT     Equipment Recommendations       Recommendations for Other Services       Precautions / Restrictions Precautions Precautions: Fall Precaution Comments: PPM, contact isolation Restrictions Weight Bearing Restrictions: No    Mobility  Bed Mobility Overal bed mobility: Modified Independent                Transfers Overall transfer level: Modified independent Equipment used: Rolling walker (2 wheeled) Transfers: Sit to/from Stand Sit to Stand: Modified independent (Device/Increase time)         General transfer comment: mod indep for sit/stand with and without RW  Ambulation/Gait Ambulation/Gait assistance: Modified independent (Device/Increase time) Ambulation Distance (Feet): 240 Feet Assistive device: Rolling walker (2 wheeled)       General Gait Details: reciprocal stepping with nearly symmetrical stance time, step height/length; good cadence and overall gait speed.  Good L LE heel strike and toe off with good knee control througout gait cycle.   Stairs            Wheelchair Mobility    Modified Rankin (Stroke Patients Only)       Balance                                    Cognition Arousal/Alertness: Awake/alert Behavior During Therapy: WFL for tasks assessed/performed Overall Cognitive Status: Within Functional Limits for tasks assessed                      Exercises Other Exercises Other  Exercises: 30 second sit/stand without assist device, mod indep (good WBing bilat LEs): 9 repetitions.  good stability; indicative of good LE strength/power, minimal fall risk.    General Comments        Pertinent Vitals/Pain Pain Score: 4  Pain Location: L knee Pain Descriptors / Indicators: Aching Pain Intervention(s): Limited activity within patient's tolerance;Monitored during session;Repositioned    Home Living                      Prior Function            PT Goals (current goals can now be found in the care plan section) Acute Rehab PT Goals Patient Stated Goal: go home PT Goal Formulation: With patient Time For Goal Achievement: 05/20/15 Potential to Achieve Goals: Good Progress towards PT goals: Progressing toward goals    Frequency  BID    PT Plan Current plan remains appropriate    Co-evaluation             End of Session Equipment Utilized During Treatment: Gait belt Activity Tolerance: Patient tolerated treatment well Patient left: in chair;with call bell/phone within reach;with chair alarm set     Time: 9798-9211 PT Time Calculation (min) (ACUTE ONLY): 14 min  Charges:  $Gait Training: 8-22 mins  G Codes:      Roger Austin, PT, DPT, NCS 05/07/2015, 4:06 PM [phone]

## 2015-05-07 NOTE — Care Management Note (Addendum)
Case Management Note  Patient Details  Name: TAYTUM LAIL MRN: [id number] Date of Birth: [birth year]  Subjective/Objective:      Discharging home today with [facility] by [facility]. Lovenox called to [employer], co pay is $4.00.  Has DME equipment at home. Corliss Blacker at [facility] notified.              Action/Plan:   Expected Discharge Date:                  Expected Discharge Plan:     In-House Referral:     Discharge planning Services     Post Acute Care Choice:    Choice offered to:     DME Arranged:    DME Agency:     HH Arranged:    [place] Agency:     Status of Service:     Medicare Important Message Given:    Date Medicare IM Given:    Medicare IM give by:    Date Additional Medicare IM Given:    Additional Medicare Important Message give by:     If discussed at [place] of Stay Meetings, dates discussed:    Additional Comments:  Shamond Skelton A, RN 05/07/2015, 8:31 AM

## 2015-05-07 NOTE — Progress Notes (Signed)
   Subjective: 2 Days Post-Op Procedure(s) (LRB): COMPUTER ASSISTED TOTAL KNEE ARTHROPLASTY (Left) HARDWARE REMOVAL (Left) Patient reports pain as 2 on 0-10 scale.   Patient is well, and has had no acute complaints or problems We'll continue with physical therapy today.  Plan is to go Home after hospital stay. no nausea and no vomiting Patient denies any chest pains or shortness of breath. Objective: Vital signs in last 24 hours: Temp:  [96.8 F (36 C)-98 F (36.7 C)] 97.4 F (36.3 C) (02/24 0336) Pulse Rate:  [54-69] 60 (02/24 0336) Resp:  [16-18] 18 (02/24 0336) BP: (106-126)/(58-74) 117/74 mmHg (02/24 0336) SpO2:  [94 %-97 %] 96 % (02/24 0336) well approximated incision Heels are non tender and elevated off the bed using rolled towels Intake/Output from previous day: 02/23 0701 - 02/24 0700 In: 960 [P.O.:960] Out: 2120 [Urine:1750; Drains:370] Intake/Output this shift:     Recent Labs  05/06/15 0651 05/07/15 0434  HGB 13.3 12.4*    Recent Labs  05/06/15 0651 05/07/15 0434  WBC 10.5 7.1  RBC 4.16* 3.86*  HCT 38.8* 36.4*  PLT 164 150    Recent Labs  05/06/15 0651  NA 137  K 4.1  CL 102  CO2 31  BUN 26*  CREATININE 1.08  GLUCOSE 92  CALCIUM 8.3*   No results for input(s): LABPT, INR in the last 72 hours.  EXAM General - Patient is Alert, Appropriate and Oriented Extremity - Neurologically intact Neurovascular intact Sensation intact distally Intact pulses distally Dorsiflexion/Plantar flexion intact Dressing - moderate drainage to the distal incision site. Otherwise the incision is completely clean Motor Function - intact, moving foot and toes well on exam.    Past Medical History  Diagnosis Date  . H/O knee surgery 2004    plate and strap inserted.  to be removed for surgery with tkr  . Hyperlipidemia   . Presence of permanent cardiac pacemaker   . Heart murmur   . Anxiety     Assessment/Plan: 2 Days Post-Op Procedure(s)  (LRB): COMPUTER ASSISTED TOTAL KNEE ARTHROPLASTY (Left) HARDWARE REMOVAL (Left) Active Problems:   S/P total knee arthroplasty  Estimated body mass index is 28.47 kg/(m^2) as calculated from the following:   Height as of this encounter: 6' (1.829 m).   Weight as of this encounter: 95.255 kg (210 lb). Up with therapy Discharge home with home health this p.m.  Labs: were reviewed DVT Prophylaxis - Lovenox, Foot Pumps and TED hose Weight-Bearing as tolerated to left leg Hemovac discontinued today  Jon R. Erwin Semmes 05/07/2015, 7:26 AM

## 2015-10-28 DIAGNOSIS — I495 Sick sinus syndrome: Secondary | ICD-10-CM | POA: Insufficient documentation

## 2016-07-31 ENCOUNTER — Other Ambulatory Visit: Payer: Self-pay | Admitting: Rheumatology

## 2016-07-31 DIAGNOSIS — M542 Cervicalgia: Secondary | ICD-10-CM

## 2016-07-31 DIAGNOSIS — M25519 Pain in unspecified shoulder: Secondary | ICD-10-CM

## 2016-08-03 ENCOUNTER — Ambulatory Visit: Payer: Managed Care, Other (non HMO)

## 2016-08-08 ENCOUNTER — Ambulatory Visit: Payer: Managed Care, Other (non HMO)

## 2016-09-04 DIAGNOSIS — R0600 Dyspnea, unspecified: Secondary | ICD-10-CM | POA: Insufficient documentation

## 2016-09-04 DIAGNOSIS — R0602 Shortness of breath: Secondary | ICD-10-CM

## 2016-09-04 DIAGNOSIS — R42 Dizziness and giddiness: Secondary | ICD-10-CM | POA: Insufficient documentation

## 2016-11-16 ENCOUNTER — Other Ambulatory Visit: Payer: Self-pay | Admitting: Neurosurgery

## 2016-11-16 DIAGNOSIS — M4722 Other spondylosis with radiculopathy, cervical region: Secondary | ICD-10-CM

## 2016-12-01 ENCOUNTER — Ambulatory Visit
Admission: RE | Admit: 2016-12-01 | Discharge: 2016-12-01 | Disposition: A | Discharge status: Home / Self Care | Payer: Managed Care, Other (non HMO) | Source: Ambulatory Visit | Attending: Neurosurgery | Admitting: Neurosurgery

## 2016-12-01 DIAGNOSIS — M4722 Other spondylosis with radiculopathy, cervical region: Secondary | ICD-10-CM

## 2016-12-01 MED ORDER — DIAZEPAM 5 MG PO TABS
10.0000 mg | ORAL_TABLET | Freq: Once | ORAL | Status: AC
Start: 2016-12-01 — End: 2016-12-01
  Administered 2016-12-01: 10 mg via ORAL

## 2016-12-01 MED ORDER — IOPAMIDOL (ISOVUE-M 300) INJECTION 61%
10.0000 mL | Freq: Once | INTRAMUSCULAR | Status: AC | PRN
  Administered 2016-12-01: 10 mL via INTRATHECAL

## 2016-12-01 NOTE — Discharge Instructions (Signed)
Myelogram Discharge Instructions  1. Go home and rest quietly for the next 24 hours.  It is important to lie flat for the next 24 hours.  Get up only to go to the restroom.  You may lie in the bed or on a couch on your back, your stomach, your left side or your right side.  You may have one pillow under your head.  You may have pillows between your knees while you are on your side or under your knees while you are on your back.  2. DO NOT drive today.  Recline the seat as far back as it will go, while still wearing your seat belt, on the way home.  3. You may get up to go to the bathroom as needed.  You may sit up for 10 minutes to eat.  You may resume your normal diet and medications unless otherwise indicated.  Drink lots of extra fluids today and tomorrow.  4. The incidence of headache, nausea, or vomiting is about 5% (one in 20 patients).  If you develop a headache, lie flat and drink plenty of fluids until the headache goes away.  Caffeinated beverages may be helpful.  If you develop severe nausea and vomiting or a headache that does not go away with flat bed rest, call [phone].  5. You may resume normal activities after your 24 hours of bed rest is over; however, do not exert yourself strongly or do any heavy lifting tomorrow. If when you get up you have a headache when standing, go back to bed and force fluids for another 24 hours.  6. Call your physician for a follow-up appointment.  The results of your myelogram will be sent directly to your physician by the following day.  7. If you have any questions or if complications develop after you arrive home, please call [phone].  Discharge instructions have been explained to the patient.  The patient, or the person responsible for the patient, fully understands these instructions.      May resume Ultram/Tramadol on Sept. 22, 2018, after 1:00 pm.

## 2016-12-01 NOTE — Progress Notes (Signed)
Pt states he has been off Tramadol for the past week.

## 2017-07-10 ENCOUNTER — Emergency Department
Admission: EM | Admit: 2017-07-10 | Discharge: 2017-07-10 | Disposition: A | Discharge status: Home / Self Care | Payer: Managed Care, Other (non HMO) | Attending: Emergency Medicine | Admitting: Emergency Medicine

## 2017-07-10 ENCOUNTER — Encounter: Payer: Self-pay | Admitting: Emergency Medicine

## 2017-07-10 ENCOUNTER — Other Ambulatory Visit: Payer: Self-pay

## 2017-07-10 DIAGNOSIS — E860 Dehydration: Secondary | ICD-10-CM

## 2017-07-10 DIAGNOSIS — Z79899 Other long term (current) drug therapy: Secondary | ICD-10-CM | POA: Diagnosis not present

## 2017-07-10 DIAGNOSIS — K529 Noninfective gastroenteritis and colitis, unspecified: Secondary | ICD-10-CM | POA: Diagnosis not present

## 2017-07-10 DIAGNOSIS — Z7982 Long term (current) use of aspirin: Secondary | ICD-10-CM | POA: Diagnosis not present

## 2017-07-10 DIAGNOSIS — Z96661 Presence of right artificial ankle joint: Secondary | ICD-10-CM | POA: Insufficient documentation

## 2017-07-10 DIAGNOSIS — R112 Nausea with vomiting, unspecified: Secondary | ICD-10-CM | POA: Diagnosis present

## 2017-07-10 DIAGNOSIS — Z950 Presence of cardiac pacemaker: Secondary | ICD-10-CM | POA: Insufficient documentation

## 2017-07-10 DIAGNOSIS — R197 Diarrhea, unspecified: Secondary | ICD-10-CM

## 2017-07-10 DIAGNOSIS — Z96653 Presence of artificial knee joint, bilateral: Secondary | ICD-10-CM | POA: Insufficient documentation

## 2017-07-10 DIAGNOSIS — I10 Essential (primary) hypertension: Secondary | ICD-10-CM | POA: Insufficient documentation

## 2017-07-10 LAB — COMPREHENSIVE METABOLIC PANEL
ALT: 34 U/L (ref 17–63)
ANION GAP: 10 (ref 5–15)
AST: 37 U/L (ref 15–41)
Albumin: 4.3 g/dL (ref 3.5–5.0)
Alkaline Phosphatase: 63 U/L (ref 38–126)
BUN: 37 mg/dL — ABNORMAL HIGH (ref 6–20)
CHLORIDE: 105 mmol/L (ref 101–111)
CO2: 21 mmol/L — ABNORMAL LOW (ref 22–32)
Calcium: 8.4 mg/dL — ABNORMAL LOW (ref 8.9–10.3)
Creatinine, Ser: 1.62 mg/dL — ABNORMAL HIGH (ref 0.61–1.24)
GFR calc non Af Amer: 44 mL/min — ABNORMAL LOW (ref >60)
GFR, EST AFRICAN AMERICAN: 51 mL/min — AB (ref >60)
Glucose, Bld: 102 mg/dL — ABNORMAL HIGH (ref 65–99)
Potassium: 3.7 mmol/L (ref 3.5–5.1)
SODIUM: 136 mmol/L (ref 135–145)
Total Bilirubin: 0.9 mg/dL (ref 0.3–1.2)
Total Protein: 7.3 g/dL (ref 6.5–8.1)

## 2017-07-10 LAB — CBC
HEMATOCRIT: 51.2 % (ref 40.0–52.0)
HEMOGLOBIN: 17.3 g/dL (ref 13.0–18.0)
MCH: 32.3 pg (ref 26.0–34.0)
MCHC: 33.9 g/dL (ref 32.0–36.0)
MCV: 95.5 fL (ref 80.0–100.0)
Platelets: 223 10*3/uL (ref 150–440)
RBC: 5.36 MIL/uL (ref 4.40–5.90)
RDW: 15 % — ABNORMAL HIGH (ref 11.5–14.5)
WBC: 6.7 10*3/uL (ref 3.8–10.6)

## 2017-07-10 LAB — LIPASE, BLOOD: LIPASE: 39 U/L (ref 11–51)

## 2017-07-10 MED ORDER — FAMOTIDINE 20 MG PO TABS: 20.0000 mg | ORAL_TABLET | Freq: Two times a day (BID) | ORAL | 0 refills | Status: DC

## 2017-07-10 MED ORDER — ONDANSETRON HCL 4 MG/2ML IJ SOLN
4.0000 mg | Freq: Once | INTRAMUSCULAR | Status: AC
Start: 2017-07-10 — End: 2017-07-10
  Administered 2017-07-10: 4 mg via INTRAVENOUS
  Filled 2017-07-10: qty 2

## 2017-07-10 MED ORDER — ONDANSETRON 4 MG PO TBDP: 4.0000 mg | ORAL_TABLET | Freq: Three times a day (TID) | ORAL | 0 refills | Status: DC | PRN

## 2017-07-10 MED ORDER — DIPHENHYDRAMINE HCL 50 MG/ML IJ SOLN
25.0000 mg | Freq: Once | INTRAMUSCULAR | Status: AC
  Administered 2017-07-10: 25 mg via INTRAVENOUS
  Filled 2017-07-10: qty 1

## 2017-07-10 MED ORDER — LACTATED RINGERS IV SOLN
2000.0000 mL | Freq: Once | INTRAVENOUS | Status: AC
  Administered 2017-07-10: 2000 mL via INTRAVENOUS

## 2017-07-10 MED ORDER — SODIUM CHLORIDE 0.9 % IV BOLUS: 1000.0000 mL | Freq: Once | INTRAVENOUS | Status: DC

## 2017-07-10 NOTE — ED Notes (Signed)
First Nurse Note:  Patient to Room 18, [facility] RN aware of placement.

## 2017-07-10 NOTE — ED Triage Notes (Signed)
Pt to ED via POV c/o N/V/D since Friday. Pt states that he is sore from vomiting. Pt states that he is not able to eat a lot. Pt thinks that he may be dehydrated. Pt denies abd pain. Pt last vomited Sunday Morning. Last episode of diarrhea was this morning. Pt is in NAD at this time, VSS.

## 2017-07-10 NOTE — ED Notes (Signed)
First Nurse Note:  Patient sitting in [facility] in [place] awaiting bed in ED.  Spoke with patient about wait.  No new complaints.

## 2017-07-10 NOTE — ED Notes (Signed)
First Nurse Note:  Patient to ED via WC from [facility] with N&V, Diarrhea, SHOB, and weakness. Saw Dr. Ginette Pitman this AM at [facility].  Alert and oriented.

## 2017-07-10 NOTE — ED Notes (Signed)
Pt given sandwich tray and orange juice.
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
## 2018-03-11 DIAGNOSIS — I208 Other forms of angina pectoris: Secondary | ICD-10-CM | POA: Diagnosis present

## 2018-03-12 ENCOUNTER — Other Ambulatory Visit: Payer: Self-pay

## 2018-03-12 ENCOUNTER — Encounter
Admission: RE | Disposition: A | Discharge status: Home / Self Care | Payer: Self-pay | Source: Ambulatory Visit | Attending: Internal Medicine

## 2018-03-12 ENCOUNTER — Ambulatory Visit
Admission: RE | Admit: 2018-03-12 | Discharge: 2018-03-13 | Disposition: A | Discharge status: Home / Self Care | Payer: Managed Care, Other (non HMO) | Source: Ambulatory Visit | Attending: Internal Medicine | Admitting: Internal Medicine

## 2018-03-12 DIAGNOSIS — Z79899 Other long term (current) drug therapy: Secondary | ICD-10-CM | POA: Diagnosis not present

## 2018-03-12 DIAGNOSIS — G4733 Obstructive sleep apnea (adult) (pediatric): Secondary | ICD-10-CM | POA: Insufficient documentation

## 2018-03-12 DIAGNOSIS — I25118 Atherosclerotic heart disease of native coronary artery with other forms of angina pectoris: Secondary | ICD-10-CM | POA: Diagnosis not present

## 2018-03-12 DIAGNOSIS — I208 Other forms of angina pectoris: Secondary | ICD-10-CM | POA: Diagnosis present

## 2018-03-12 DIAGNOSIS — R0602 Shortness of breath: Secondary | ICD-10-CM

## 2018-03-12 DIAGNOSIS — R079 Chest pain, unspecified: Secondary | ICD-10-CM

## 2018-03-12 DIAGNOSIS — Z880 Allergy status to penicillin: Secondary | ICD-10-CM | POA: Diagnosis not present

## 2018-03-12 DIAGNOSIS — M1990 Unspecified osteoarthritis, unspecified site: Secondary | ICD-10-CM | POA: Insufficient documentation

## 2018-03-12 DIAGNOSIS — E785 Hyperlipidemia, unspecified: Secondary | ICD-10-CM | POA: Diagnosis not present

## 2018-03-12 DIAGNOSIS — I495 Sick sinus syndrome: Secondary | ICD-10-CM | POA: Insufficient documentation

## 2018-03-12 DIAGNOSIS — Z7982 Long term (current) use of aspirin: Secondary | ICD-10-CM | POA: Diagnosis not present

## 2018-03-12 DIAGNOSIS — Z8673 Personal history of transient ischemic attack (TIA), and cerebral infarction without residual deficits: Secondary | ICD-10-CM | POA: Insufficient documentation

## 2018-03-12 DIAGNOSIS — I10 Essential (primary) hypertension: Secondary | ICD-10-CM | POA: Insufficient documentation

## 2018-03-12 DIAGNOSIS — Z8249 Family history of ischemic heart disease and other diseases of the circulatory system: Secondary | ICD-10-CM | POA: Insufficient documentation

## 2018-03-12 DIAGNOSIS — M109 Gout, unspecified: Secondary | ICD-10-CM | POA: Insufficient documentation

## 2018-03-12 HISTORY — PX: CORONARY STENT INTERVENTION: CATH118234

## 2018-03-12 HISTORY — PX: LEFT HEART CATH AND CORONARY ANGIOGRAPHY: CATH118249

## 2018-03-12 LAB — CREATININE, SERUM
Creatinine, Ser: 1.03 mg/dL (ref 0.61–1.24)
GFR calc Af Amer: >60 mL/min (ref >60)
GFR calc non Af Amer: >60 mL/min (ref >60)

## 2018-03-12 LAB — CBC
HCT: 40.8 % (ref 39.0–52.0)
Hemoglobin: 14.1 g/dL (ref 13.0–17.0)
MCH: 32.5 pg (ref 26.0–34.0)
MCHC: 34.6 g/dL (ref 30.0–36.0)
MCV: 94 fL (ref 80.0–100.0)
NRBC: 0 % (ref 0.0–0.2)
Platelets: 197 10*3/uL (ref 150–400)
RBC: 4.34 MIL/uL (ref 4.22–5.81)
RDW: 13.2 % (ref 11.5–15.5)
WBC: 6.5 10*3/uL (ref 4.0–10.5)

## 2018-03-12 LAB — MRSA PCR SCREENING: MRSA by PCR: NEGATIVE

## 2018-03-12 SURGERY — LEFT HEART CATH AND CORONARY ANGIOGRAPHY
Anesthesia: Moderate Sedation

## 2018-03-12 MED ORDER — SODIUM CHLORIDE 0.9 % IV SOLN
INTRAVENOUS | Status: AC
  Administered 2018-03-12: 17:00:00 via INTRAVENOUS

## 2018-03-12 MED ORDER — HYDRALAZINE HCL 20 MG/ML IJ SOLN: 5.0000 mg | INTRAMUSCULAR | Status: AC | PRN

## 2018-03-12 MED ORDER — ASPIRIN 81 MG PO CHEW
81.0000 mg | CHEWABLE_TABLET | Freq: Every day | ORAL | Status: DC
  Administered 2018-03-13: 81 mg via ORAL
  Filled 2018-03-12: qty 1

## 2018-03-12 MED ORDER — MIDAZOLAM HCL 2 MG/2ML IJ SOLN
INTRAMUSCULAR | Status: DC | PRN
  Administered 2018-03-12: 1 mg via INTRAVENOUS

## 2018-03-12 MED ORDER — ASPIRIN 81 MG PO CHEW
CHEWABLE_TABLET | ORAL | Status: AC
  Filled 2018-03-12: qty 1

## 2018-03-12 MED ORDER — HEPARIN SODIUM (PORCINE) 1000 UNIT/ML IJ SOLN
INTRAMUSCULAR | Status: AC
  Filled 2018-03-12: qty 1

## 2018-03-12 MED ORDER — SODIUM CHLORIDE 0.9% FLUSH: 3.0000 mL | INTRAVENOUS | Status: DC | PRN

## 2018-03-12 MED ORDER — IOPAMIDOL (ISOVUE-300) INJECTION 61%
INTRAVENOUS | Status: DC | PRN
  Administered 2018-03-12: 175 mL via INTRA_ARTERIAL

## 2018-03-12 MED ORDER — FENTANYL CITRATE (PF) 100 MCG/2ML IJ SOLN
INTRAMUSCULAR | Status: DC | PRN
  Administered 2018-03-12: 25 ug via INTRAVENOUS

## 2018-03-12 MED ORDER — NITROGLYCERIN 5 MG/ML IV SOLN
INTRAVENOUS | Status: AC
  Filled 2018-03-12: qty 10

## 2018-03-12 MED ORDER — SODIUM CHLORIDE 0.9% FLUSH
3.0000 mL | Freq: Two times a day (BID) | INTRAVENOUS | Status: DC
  Administered 2018-03-12 – 2018-03-13 (×2): 3 mL via INTRAVENOUS

## 2018-03-12 MED ORDER — SODIUM CHLORIDE 0.9% FLUSH: 3.0000 mL | Freq: Two times a day (BID) | INTRAVENOUS | Status: DC

## 2018-03-12 MED ORDER — HEPARIN (PORCINE) IN NACL 1000-0.9 UT/500ML-% IV SOLN
INTRAVENOUS | Status: AC
  Filled 2018-03-12: qty 1000

## 2018-03-12 MED ORDER — SODIUM CHLORIDE 0.9 % IV SOLN: 250.0000 mL | INTRAVENOUS | Status: DC | PRN

## 2018-03-12 MED ORDER — VERAPAMIL HCL 2.5 MG/ML IV SOLN
INTRAVENOUS | Status: DC | PRN
  Administered 2018-03-12: 2.5 mg via INTRA_ARTERIAL

## 2018-03-12 MED ORDER — NITROGLYCERIN 1 MG/10 ML FOR IR/CATH LAB
INTRA_ARTERIAL | Status: DC | PRN
  Administered 2018-03-12 (×2): 200 ug via INTRACORONARY

## 2018-03-12 MED ORDER — HEPARIN SODIUM (PORCINE) 1000 UNIT/ML IJ SOLN
INTRAMUSCULAR | Status: DC | PRN
  Administered 2018-03-12: 5000 [IU] via INTRAVENOUS
  Administered 2018-03-12: 2000 [IU] via INTRAVENOUS
  Administered 2018-03-12: 3000 [IU] via INTRAVENOUS

## 2018-03-12 MED ORDER — FENTANYL CITRATE (PF) 100 MCG/2ML IJ SOLN
INTRAMUSCULAR | Status: DC | PRN
  Administered 2018-03-12: 25 ug via INTRAVENOUS
  Administered 2018-03-12: 50 ug via INTRAVENOUS

## 2018-03-12 MED ORDER — HEPARIN SODIUM (PORCINE) 1000 UNIT/ML IJ SOLN
INTRAMUSCULAR | Status: DC | PRN
  Administered 2018-03-12: 4500 [IU] via INTRAVENOUS

## 2018-03-12 MED ORDER — ENOXAPARIN SODIUM 40 MG/0.4ML ~~LOC~~ SOLN
40.0000 mg | SUBCUTANEOUS | Status: DC
  Administered 2018-03-13: 40 mg via SUBCUTANEOUS
  Filled 2018-03-12: qty 0.4

## 2018-03-12 MED ORDER — ASPIRIN 81 MG PO CHEW
81.0000 mg | CHEWABLE_TABLET | ORAL | Status: AC
  Administered 2018-03-12: 81 mg via ORAL

## 2018-03-12 MED ORDER — SODIUM CHLORIDE 0.9 % WEIGHT BASED INFUSION
3.0000 mL/kg/h | INTRAVENOUS | Status: DC
  Administered 2018-03-12: 3 mL/kg/h via INTRAVENOUS

## 2018-03-12 MED ORDER — ASPIRIN 81 MG PO CHEW
CHEWABLE_TABLET | ORAL | Status: AC
  Filled 2018-03-12: qty 3

## 2018-03-12 MED ORDER — ASPIRIN 81 MG PO CHEW
CHEWABLE_TABLET | ORAL | Status: DC | PRN
  Administered 2018-03-12: 243 mg via ORAL

## 2018-03-12 MED ORDER — SODIUM CHLORIDE 0.9 % WEIGHT BASED INFUSION: 1.0000 mL/kg/h | INTRAVENOUS | Status: DC

## 2018-03-12 MED ORDER — ACETAMINOPHEN 325 MG PO TABS: 650.0000 mg | ORAL_TABLET | ORAL | Status: DC | PRN

## 2018-03-12 MED ORDER — GABAPENTIN 300 MG PO CAPS
300.0000 mg | ORAL_CAPSULE | Freq: Four times a day (QID) | ORAL | Status: DC
  Administered 2018-03-12 – 2018-03-13 (×4): 300 mg via ORAL
  Filled 2018-03-12 (×4): qty 1

## 2018-03-12 MED ORDER — FENTANYL CITRATE (PF) 100 MCG/2ML IJ SOLN
INTRAMUSCULAR | Status: AC
  Filled 2018-03-12: qty 2

## 2018-03-12 MED ORDER — CLOPIDOGREL BISULFATE 75 MG PO TABS
ORAL_TABLET | ORAL | Status: AC
  Filled 2018-03-12: qty 8

## 2018-03-12 MED ORDER — VERAPAMIL HCL 2.5 MG/ML IV SOLN
INTRAVENOUS | Status: AC
  Filled 2018-03-12: qty 2

## 2018-03-12 MED ORDER — CLOPIDOGREL BISULFATE 75 MG PO TABS
75.0000 mg | ORAL_TABLET | Freq: Every day | ORAL | Status: DC
  Administered 2018-03-13: 75 mg via ORAL
  Filled 2018-03-12: qty 1

## 2018-03-12 MED ORDER — TRAMADOL HCL 50 MG PO TABS: 50.0000 mg | ORAL_TABLET | ORAL | Status: DC | PRN

## 2018-03-12 MED ORDER — LABETALOL HCL 5 MG/ML IV SOLN: 10.0000 mg | INTRAVENOUS | Status: AC | PRN

## 2018-03-12 MED ORDER — IOPAMIDOL (ISOVUE-300) INJECTION 61%
INTRAVENOUS | Status: DC | PRN
  Administered 2018-03-12: 85 mL via INTRA_ARTERIAL

## 2018-03-12 MED ORDER — ONDANSETRON HCL 4 MG/2ML IJ SOLN: 4.0000 mg | Freq: Four times a day (QID) | INTRAMUSCULAR | Status: DC | PRN

## 2018-03-12 MED ORDER — MIDAZOLAM HCL 2 MG/2ML IJ SOLN
INTRAMUSCULAR | Status: AC
  Filled 2018-03-12: qty 2

## 2018-03-12 MED ORDER — CLOPIDOGREL BISULFATE 75 MG PO TABS
ORAL_TABLET | ORAL | Status: DC | PRN
  Administered 2018-03-12: 600 mg via ORAL

## 2018-03-12 SURGICAL SUPPLY — 17 items
BALLN MINITREK RX 2.0X8 (BALLOONS) ×3
BALLN ~~LOC~~ TREK RX 2.75X12 (BALLOONS) ×3
BALLN ~~LOC~~ TREK RX 3.25X12 (BALLOONS) ×3
BALLOON MINITREK RX 2.0X8 (BALLOONS) ×2 IMPLANT
BALLOON ~~LOC~~ TREK RX 2.75X12 (BALLOONS) ×2 IMPLANT
BALLOON ~~LOC~~ TREK RX 3.25X12 (BALLOONS) ×2 IMPLANT
CATH LAUNCHER 6FR JR4 (CATHETERS) ×3 IMPLANT
CATH OPTITORQUE JACKY 4.0 5F (CATHETERS) ×3 IMPLANT
DEVICE INFLAT 30 PLUS (MISCELLANEOUS) ×3 IMPLANT
DEVICE RAD TR BAND REGULAR (VASCULAR PRODUCTS) ×3 IMPLANT
GLIDESHEATH SLEND SS 6F .021 (SHEATH) ×3 IMPLANT
KIT MANI 3VAL PERCEP (MISCELLANEOUS) ×3 IMPLANT
PACK CARDIAC CATH (CUSTOM PROCEDURE TRAY) ×3 IMPLANT
STENT SIERRA 2.25 X 15 MM (Permanent Stent) ×3 IMPLANT
STENT SIERRA 3.00 X 12 MM (Permanent Stent) ×3 IMPLANT
WIRE ROSEN-J .035X260CM (WIRE) ×3 IMPLANT
WIRE RUNTHROUGH .014X180CM (WIRE) ×3 IMPLANT

## 2018-03-12 NOTE — Progress Notes (Signed)
Patient admitted to unit. Oriented to room, call bell, and staff. Bed in lowest position. Fall safety plan reviewed. Full assessment to Epic. Skin assessment verified with Stacie Glaze RN. Telemetry box verification with tele clerk- Box#: [id number]. Right arm elevated on pillows following radial cath. Family at bedside. Will continue to monitor.

## 2018-03-12 NOTE — Plan of Care (Signed)
  Problem: Clinical Measurements: Goal: Cardiovascular complication will be avoided Outcome: Progressing   Problem: Education: Goal: Understanding of CV disease, CV risk reduction, and recovery process will improve Outcome: Progressing   Problem: Activity: Goal: Ability to return to baseline activity level will improve Outcome: Progressing   Problem: Cardiovascular: Goal: Ability to achieve and maintain adequate cardiovascular perfusion will improve Outcome: Progressing Goal: Vascular access site(s) Level 0-1 will be maintained Outcome: Progressing

## 2018-03-12 NOTE — Progress Notes (Signed)
Cardiac stent card given to patient and patient's wife.

## 2018-03-12 NOTE — Brief Op Note (Signed)
BRIEF CARDIAC CATHETERIZATION NOTE  DATE: 03/12/2018 TIME: 9:42 AM  PATIENT:  Roger Austin  62 y.o. male  PRE-OPERATIVE DIAGNOSIS:  Stable angina  POST-OPERATIVE DIAGNOSIS:  Same  PROCEDURE:  Procedure(s): LEFT HEART CATH AND CORONARY ANGIOGRAPHY (Left) CORONARY STENT INTERVENTION (N/A)  SURGEON:  Surgeon(s) and Role: Panel 1:    * Corey Skains, MD - Primary Panel 2:    Saunders Revel, Harrell Gave, MD - Primary  FINDINGS: 1. Severe single-vessel coronary artery disease with sequential 75% ostial and mid RCA (codominant) stenoses. 2. Successful PCI to RCA using Xience Sierra 3.0 x 12 mm (ostial) and 2.25 x 15 mm (mid) drug-eluting stents.  RECOMMENDATIONS: 1. DAPT with ASA and clopidogrel for at least 6 months. 2. Aggressive secondary prevention. 3. Overnight extended recovery; anticipate discharge home tomorrow.  Nelva Bush, MD [facility] Pager: [phone]

## 2018-03-13 DIAGNOSIS — Z880 Allergy status to penicillin: Secondary | ICD-10-CM | POA: Diagnosis not present

## 2018-03-13 DIAGNOSIS — I10 Essential (primary) hypertension: Secondary | ICD-10-CM | POA: Diagnosis not present

## 2018-03-13 DIAGNOSIS — M1990 Unspecified osteoarthritis, unspecified site: Secondary | ICD-10-CM | POA: Diagnosis not present

## 2018-03-13 DIAGNOSIS — Z7982 Long term (current) use of aspirin: Secondary | ICD-10-CM | POA: Diagnosis not present

## 2018-03-13 DIAGNOSIS — G4733 Obstructive sleep apnea (adult) (pediatric): Secondary | ICD-10-CM | POA: Diagnosis not present

## 2018-03-13 DIAGNOSIS — M109 Gout, unspecified: Secondary | ICD-10-CM | POA: Diagnosis not present

## 2018-03-13 DIAGNOSIS — Z8673 Personal history of transient ischemic attack (TIA), and cerebral infarction without residual deficits: Secondary | ICD-10-CM | POA: Diagnosis not present

## 2018-03-13 DIAGNOSIS — Z8249 Family history of ischemic heart disease and other diseases of the circulatory system: Secondary | ICD-10-CM | POA: Diagnosis not present

## 2018-03-13 DIAGNOSIS — Z79899 Other long term (current) drug therapy: Secondary | ICD-10-CM | POA: Diagnosis not present

## 2018-03-13 DIAGNOSIS — I208 Other forms of angina pectoris: Secondary | ICD-10-CM | POA: Diagnosis present

## 2018-03-13 DIAGNOSIS — R0602 Shortness of breath: Secondary | ICD-10-CM | POA: Diagnosis not present

## 2018-03-13 DIAGNOSIS — Z955 Presence of coronary angioplasty implant and graft: Secondary | ICD-10-CM

## 2018-03-13 DIAGNOSIS — E785 Hyperlipidemia, unspecified: Secondary | ICD-10-CM | POA: Diagnosis not present

## 2018-03-13 DIAGNOSIS — I25118 Atherosclerotic heart disease of native coronary artery with other forms of angina pectoris: Secondary | ICD-10-CM | POA: Diagnosis not present

## 2018-03-13 DIAGNOSIS — I495 Sick sinus syndrome: Secondary | ICD-10-CM | POA: Diagnosis not present

## 2018-03-13 LAB — BASIC METABOLIC PANEL
ANION GAP: 5 (ref 5–15)
BUN: 22 mg/dL (ref 8–23)
CALCIUM: 8.5 mg/dL — AB (ref 8.9–10.3)
CO2: 25 mmol/L (ref 22–32)
Chloride: 108 mmol/L (ref 98–111)
Creatinine, Ser: 0.99 mg/dL (ref 0.61–1.24)
GFR calc Af Amer: >60 mL/min (ref >60)
GFR calc non Af Amer: >60 mL/min (ref >60)
Glucose, Bld: 93 mg/dL (ref 70–99)
Potassium: 4.4 mmol/L (ref 3.5–5.1)
Sodium: 138 mmol/L (ref 135–145)

## 2018-03-13 LAB — CBC
HCT: 41.8 % (ref 39.0–52.0)
Hemoglobin: 14.4 g/dL (ref 13.0–17.0)
MCH: 32.5 pg (ref 26.0–34.0)
MCHC: 34.4 g/dL (ref 30.0–36.0)
MCV: 94.4 fL (ref 80.0–100.0)
Platelets: 198 10*3/uL (ref 150–400)
RBC: 4.43 MIL/uL (ref 4.22–5.81)
RDW: 13.2 % (ref 11.5–15.5)
WBC: 6.8 10*3/uL (ref 4.0–10.5)
nRBC: 0 % (ref 0.0–0.2)

## 2018-03-13 MED ORDER — CLOPIDOGREL BISULFATE 75 MG PO TABS: 75.0000 mg | ORAL_TABLET | Freq: Every day | ORAL | 4 refills | Status: DC

## 2018-03-13 MED ORDER — ATORVASTATIN CALCIUM 40 MG PO TABS: 40.0000 mg | ORAL_TABLET | Freq: Every day | ORAL | 4 refills | Status: DC

## 2018-03-13 NOTE — Progress Notes (Signed)
[name] to be D/C'd Home per MD order. Patient given discharge teaching and paperwork regarding medications, diet, follow-up appointments and activity. Patient understanding verbalized. No questions or complaints at this time. Skin condition as charted. IV and telemetry removed prior to leaving.  No further needs by Care Management/Social Work. Prescriptions e-prescribed by MD.  An After Visit Summary was printed and given to the patient.   Patient escorted via wheelchair to ride home with wife.  Roger Austin

## 2018-03-13 NOTE — Progress Notes (Signed)
Notified MD of blood pressure. Will continue to monitor.

## 2018-03-13 NOTE — Progress Notes (Signed)
Dr. Nehemiah Massed aware of elevated blood pressure on prior shift, currently 162/97. No new orders at this time.

## 2018-03-13 NOTE — Discharge Summary (Signed)
[facility] Cardiology Discharge Summary  Patient ID: Roger Austin MRN: [id number] DOB/AGE: September 16, 1955 63 y.o.  Admit date: 03/12/2018 Discharge date: 03/13/2018  Primary Discharge Diagnosis: Ischemic chest pain I20.9 and Coronary artery disease with angina I25.119 Secondary Discharge Diagnosis high blood pressure and high cholesterol  Significant Diagnostic Studies: Cardiac cath with left ventricular angiogram and selective coronary injection as well as PCI and stent placement of right coronary artery.  Hospital Course: The patient was admitted to specials for cardiac cath with selective coronary angiogram after full consent, risk and benefits explained, and time out called with all approprate details voiced and discussed. The patient has had progressive canadian class 3 angina without intermediate risk stress test consistent with ischemic chest pain and or anginal equivalent with coronary artery risk factors including high blood pressure and high cholesterol. The procedure was performed without complication and it revealed normal left ventricular function with ejection fraction of 55%.  It was found that the patient had severe 1 vessel coronary atherosclerosis with significant right coronary artery stenosis requiring further intervention. Therefore, the patient had a PCI and drug eluding stent placed without complication. The patient has been ambulating without further significant symptoms and has reached his maximal hospital benefit and will be discharged to home in good condition.  Cardiac rehabilitation has been discussed and recommended. Medication management of cardiovascular risk factors will be given post discharge and modified as an outpatient.   Discharge Exam: Blood pressure (!) 170/102, pulse 60, temperature 97.8 F (36.6 C), temperature source Oral, resp. rate 16, height 6' (1.829 m), weight 92.5 kg, SpO2 95 %.  Constitutional: [place] oriented to person, place, and time. No  distress.  HENT: No nasal discharge.  Head: Normocephalic and atraumatic.  Eyes: Pupils are equal and round. No discharge.  Neck: Normal range of motion. Neck supple. No JVD present. No thyromegaly present.  Cardiovascular: Normal rate, regular rhythm, normal S1 S2, no gallop, no friction rub. No murmur Pulmonary/Chest: Effort normal, No stridor. No respiratory distress. no wheezes.  no rales.    Abdominal: Soft. Bowel sounds are normal.  no distension.  no tenderness. There is no rebound and no guarding.  Musculoskeletal: No edema, no cyanosis, normal pulses, no bleeding, Normal range of motion. no tenderness.  Neurological:  alert and oriented to person, place, and time. Coordination normal.  Skin: Skin is warm and dry. No rash noted. No erythema. No pallor.  Psychiatric:  normal mood and affect. behavior is normal.    Labs:   Lab Results  Component Value Date   WBC 6.8 03/13/2018   HGB 14.4 03/13/2018   HCT 41.8 03/13/2018   MCV 94.4 03/13/2018   PLT 198 03/13/2018    Recent Labs  Lab 03/13/18 0425  NA 138  K 4.4  CL 108  CO2 25  BUN 22  CREATININE 0.99  CALCIUM 8.5*  GLUCOSE 93    EKG: NSR without evidence of new changes  FOLLOW UP IN ONE TO TWO WEEKS Discharge Instructions    AMB Referral to Cardiac Rehabilitation - Phase II   Complete by:  As directed    Diagnosis:   Coronary Stents Stable Angina       Allergies as of 03/13/2018      Reactions   Penicillins Swelling   DID THE REACTION INVOLVE: Swelling of the face/tongue/throat, SOB, or low BP? Yes Sudden or severe rash/hives, skin peeling, or the inside of the mouth or nose? No Did it require medical treatment? No When  did it last happen?40 years ago If all above answers are "NO", may proceed with cephalosporin use.      Medication List    STOP taking these medications   celecoxib 200 MG capsule Commonly known as:  CELEBREX     TAKE these medications   aspirin 81 MG tablet Take 81 mg by  mouth every evening.   atorvastatin 40 MG tablet Commonly known as:  LIPITOR Take 1 tablet (40 mg total) by mouth daily.   clopidogrel 75 MG tablet Commonly known as:  PLAVIX Take 1 tablet (75 mg total) by mouth daily with breakfast.   gabapentin 300 MG capsule Commonly known as:  NEURONTIN Take 300 mg by mouth 4 (four) times daily.   HUMIRA 40 MG/0.4ML Pskt Generic drug:  Adalimumab Inject 40 mg into the skin every 14 (fourteen) days.   methotrexate 2.5 MG tablet Commonly known as:  RHEUMATREX Take 15 mg by mouth every Friday. Caution:Chemotherapy. Protect from light.   traMADol 50 MG tablet Commonly known as:  ULTRAM Take 1-2 tablets (50-100 mg total) by mouth every 4 (four) hours as needed for moderate pain. What changed:  how much to take        THE PATIENT  SHALL BRING ALL MEDICATIONS TO FOLLOW UP APPOINTMENT  Signed:  Corey Skains MD, [facility] 03/13/2018, 7:25 AM

## 2018-03-14 ENCOUNTER — Encounter: Payer: Self-pay | Admitting: Internal Medicine

## 2018-03-14 LAB — POCT ACTIVATED CLOTTING TIME
ACTIVATED CLOTTING TIME: 252 s
Activated Clotting Time: 230 seconds

## 2018-05-24 ENCOUNTER — Other Ambulatory Visit: Payer: Self-pay | Admitting: Rheumatology

## 2018-05-24 DIAGNOSIS — J849 Interstitial pulmonary disease, unspecified: Secondary | ICD-10-CM

## 2018-05-24 DIAGNOSIS — R0609 Other forms of dyspnea: Secondary | ICD-10-CM

## 2018-05-24 DIAGNOSIS — R0600 Dyspnea, unspecified: Secondary | ICD-10-CM

## 2018-05-31 ENCOUNTER — Ambulatory Visit
Admission: RE | Admit: 2018-05-31 | Discharge: 2018-05-31 | Disposition: A | Discharge status: Home / Self Care | Payer: No Typology Code available for payment source | Source: Ambulatory Visit | Attending: Rheumatology | Admitting: Rheumatology

## 2018-05-31 ENCOUNTER — Other Ambulatory Visit: Payer: Self-pay

## 2018-05-31 DIAGNOSIS — J849 Interstitial pulmonary disease, unspecified: Secondary | ICD-10-CM

## 2018-05-31 DIAGNOSIS — R0609 Other forms of dyspnea: Secondary | ICD-10-CM

## 2018-05-31 DIAGNOSIS — R0600 Dyspnea, unspecified: Secondary | ICD-10-CM

## 2018-09-09 ENCOUNTER — Telehealth: Payer: Self-pay

## 2018-09-09 NOTE — Telephone Encounter (Signed)
Left message requesting for patient to call back to schedule new patient appointment with Dr. Fletcher Anon per [facility] referral request.

## 2018-10-10 ENCOUNTER — Ambulatory Visit (INDEPENDENT_AMBULATORY_CARE_PROVIDER_SITE_OTHER): Payer: No Typology Code available for payment source | Admitting: Cardiovascular Disease

## 2018-10-10 ENCOUNTER — Encounter

## 2018-10-10 ENCOUNTER — Ambulatory Visit: Payer: Medicare HMO

## 2018-10-10 ENCOUNTER — Other Ambulatory Visit: Payer: Self-pay

## 2018-10-10 ENCOUNTER — Encounter: Payer: Self-pay | Admitting: Cardiovascular Disease

## 2018-10-10 ENCOUNTER — Ambulatory Visit (INDEPENDENT_AMBULATORY_CARE_PROVIDER_SITE_OTHER): Payer: Medicare HMO

## 2018-10-10 VITALS — BP 128/89 | HR 63 | Ht 73.0 in | Wt 199.5 lb

## 2018-10-10 DIAGNOSIS — I10 Essential (primary) hypertension: Secondary | ICD-10-CM

## 2018-10-10 DIAGNOSIS — R42 Dizziness and giddiness: Secondary | ICD-10-CM

## 2018-10-10 DIAGNOSIS — I2510 Atherosclerotic heart disease of native coronary artery without angina pectoris: Secondary | ICD-10-CM | POA: Diagnosis not present

## 2018-10-10 DIAGNOSIS — R0602 Shortness of breath: Secondary | ICD-10-CM
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
## 2018-10-10 NOTE — Patient Instructions (Signed)
Medication Instructions:  Your physician recommends that you continue on your current medications as directed. Please refer to the Current Medication list given to you today.  If you need a refill on your cardiac medications before your next appointment, please call your pharmacy.   Lab work: None ordered If you have labs (blood work) drawn today and your tests are completely normal, you will receive your results only by: Marland Kitchen MyChart Message (if you have MyChart) OR . A paper copy in the mail If you have any lab test that is abnormal or we need to change your treatment, we will call you to review the results.  Testing/Procedures: Your physician has requested that you have an echocardiogram. Echocardiography is a painless test that uses sound waves to create images of your heart. It provides your doctor with information about the size and shape of your heart and how well your heart's chambers and valves are working. This procedure takes approximately one hour. There are no restrictions for this procedure.  Your physician has recommended that you wear an ziomonitor. Zio monitors are medical devices that record the heart's electrical activity. Doctors most often [place] these monitors to diagnose arrhythmias. Arrhythmias are problems with the speed or rhythm of the heartbeat. The monitor is a small, portable device. You can wear one while you do your normal daily activities. This is usually used to diagnose what is causing palpitations/syncope (passing out).    Follow-Up: At [facility], you and your health needs are our priority.  As part of our continuing mission to provide you with exceptional heart care, we have created designated Provider Care Teams.  These Care Teams include your primary Cardiologist (physician) and Advanced Practice Providers (APPs -  Physician Assistants and Nurse Practitioners) who all work together to provide you with the care you need, when you need it. You will need a  follow up appointment after testing    You may see  Dr.Arida  or one of the following Advanced Practice Providers on your designated Care Team:   Murray Hodgkins, NP Christell Faith, PA-C . Marrianne Mood, PA-C  Any Other Special Instructions Will Be Listed Below (If Applicable).  Your physician has recommended that you wear a Zio monitor. This monitor is a medical device that records the heart's electrical activity. Doctors most often use these monitors to diagnose arrhythmias. Arrhythmias are problems with the speed or rhythm of the heartbeat. The monitor is a small device applied to your chest. You can wear one while you do your normal daily activities. While wearing this monitor if you have any symptoms to push the button and record what you felt. Once you have worn this monitor for the period of time provider prescribed (Usually 14 days), you will return the monitor device in the postage paid box. Once it is returned they will download the data collected and provide [place] with a report which the provider will then review and we will call you with those results. Important tips:  1. Avoid showering during the first 24 hours of wearing the monitor. 2. Avoid excessive sweating to help maximize wear time. 3. Do not submerge the device, no hot tubs, and no swimming pools. 4. Keep any lotions or oils away from the patch. 5. After 24 hours you may shower with the patch on. Take brief showers with your back facing the shower head.  6. Do not remove patch once it has been placed because that will interrupt data and decrease adhesive wear time.  7. Push the button when you have any symptoms and write down what you were feeling. 8. Once you have completed wearing your monitor, remove and place into box which has postage paid and place in your outgoing mailbox.  9. If for some reason you have misplaced your box then call our office and we can provide another box and/or mail it off for you.

## 2018-10-18 ENCOUNTER — Ambulatory Visit: Payer: No Typology Code available for payment source | Admitting: Cardiovascular Disease

## 2018-10-24 ENCOUNTER — Other Ambulatory Visit: Payer: Self-pay | Admitting: Sports Medicine

## 2018-10-24 ENCOUNTER — Ambulatory Visit: Payer: No Typology Code available for payment source | Admitting: Cardiovascular Disease

## 2018-10-24 DIAGNOSIS — M25512 Pain in left shoulder: Secondary | ICD-10-CM

## 2018-10-24 DIAGNOSIS — M75102 Unspecified rotator cuff tear or rupture of left shoulder, not specified as traumatic: Secondary | ICD-10-CM

## 2018-10-24 DIAGNOSIS — R42 Dizziness and giddiness: Secondary | ICD-10-CM

## 2018-10-24 DIAGNOSIS — S46212A Strain of muscle, fascia and tendon of other parts of biceps, left arm, initial encounter: Secondary | ICD-10-CM

## 2018-10-30 ENCOUNTER — Ambulatory Visit (INDEPENDENT_AMBULATORY_CARE_PROVIDER_SITE_OTHER): Payer: No Typology Code available for payment source

## 2018-10-30 ENCOUNTER — Other Ambulatory Visit: Payer: Self-pay

## 2018-10-30 DIAGNOSIS — R0602 Shortness of breath: Secondary | ICD-10-CM

## 2018-10-30 MED ORDER — PERFLUTREN LIPID MICROSPHERE
1.0000 mL | INTRAVENOUS | Status: AC | PRN
  Administered 2018-10-30: 2 mL via INTRAVENOUS

## 2018-11-06 ENCOUNTER — Ambulatory Visit
Admission: RE | Admit: 2018-11-06 | Discharge: 2018-11-06 | Disposition: A | Discharge status: Home / Self Care | Payer: No Typology Code available for payment source | Source: Ambulatory Visit | Attending: Sports Medicine | Admitting: Sports Medicine

## 2018-11-06 ENCOUNTER — Other Ambulatory Visit: Payer: Self-pay

## 2018-11-06 DIAGNOSIS — M25512 Pain in left shoulder: Secondary | ICD-10-CM

## 2018-11-06 DIAGNOSIS — M75102 Unspecified rotator cuff tear or rupture of left shoulder, not specified as traumatic: Secondary | ICD-10-CM

## 2018-11-06 DIAGNOSIS — S45212A Laceration of axillary or brachial vein, left side, initial encounter: Secondary | ICD-10-CM | POA: Insufficient documentation

## 2018-11-06 DIAGNOSIS — S46212A Strain of muscle, fascia and tendon of other parts of biceps, left arm, initial encounter: Secondary | ICD-10-CM

## 2018-11-06 DIAGNOSIS — M75122 Complete rotator cuff tear or rupture of left shoulder, not specified as traumatic: Secondary | ICD-10-CM | POA: Insufficient documentation

## 2018-11-06 MED ORDER — LIDOCAINE HCL (PF) 1 % IJ SOLN
5.0000 mL | Freq: Once | INTRAMUSCULAR | Status: AC
  Administered 2018-11-06: 5 mL
  Filled 2018-11-06: qty 5

## 2018-11-06 MED ORDER — IOHEXOL 180 MG/ML  SOLN
10.0000 mL | Freq: Once | INTRAMUSCULAR | Status: AC | PRN
  Administered 2018-11-06: 9 mL via INTRA_ARTICULAR

## 2018-11-06 MED ORDER — SODIUM CHLORIDE (PF) 0.9 % IJ SOLN
5.0000 mL | Freq: Once | INTRAMUSCULAR | Status: AC
  Administered 2018-11-06: 09:00:00 3 mL

## 2018-11-07 ENCOUNTER — Telehealth: Payer: Self-pay

## 2018-11-07 NOTE — Telephone Encounter (Signed)
Patient made aware of echo results with verbal understanding.

## 2018-11-12 ENCOUNTER — Other Ambulatory Visit: Payer: Self-pay | Admitting: *Deleted

## 2018-11-12 DIAGNOSIS — R42 Dizziness and giddiness: Secondary | ICD-10-CM

## 2018-11-13 ENCOUNTER — Other Ambulatory Visit: Payer: Self-pay

## 2018-11-13 ENCOUNTER — Encounter: Payer: Self-pay | Admitting: Nurse Practitioner

## 2018-11-13 ENCOUNTER — Ambulatory Visit (INDEPENDENT_AMBULATORY_CARE_PROVIDER_SITE_OTHER): Payer: Medicare HMO | Admitting: Nurse Practitioner

## 2018-11-13 VITALS — BP 122/70 | HR 64 | Ht 72.0 in | Wt 198.0 lb

## 2018-11-13 DIAGNOSIS — I495 Sick sinus syndrome: Secondary | ICD-10-CM | POA: Diagnosis not present

## 2018-11-13 DIAGNOSIS — I951 Orthostatic hypotension: Secondary | ICD-10-CM | POA: Diagnosis not present

## 2018-11-13 DIAGNOSIS — I2510 Atherosclerotic heart disease of native coronary artery without angina pectoris: Secondary | ICD-10-CM | POA: Diagnosis not present

## 2018-11-13 DIAGNOSIS — R0600 Dyspnea, unspecified: Secondary | ICD-10-CM

## 2018-11-13 DIAGNOSIS — R0609 Other forms of dyspnea: Secondary | ICD-10-CM | POA: Diagnosis not present

## 2018-11-13 NOTE — Patient Instructions (Addendum)
Medication Instructions:  Your physician recommends that you continue on your current medications as directed. Please refer to the Current Medication list given to you today.  If you need a refill on your cardiac medications before your next appointment, please call your pharmacy.   Lab work: 1- Covid 19 Pre Screening- Please report to the medical mall for CV 19 pre test 9/25 (12:30-2:30)  Drive thru testing, stay in your car. Let staff know you have an upcoming procedure and order has been placed for testing.  After your test, you must wear a mask and stay in isolation until after procedure.   If you have labs (blood work) drawn today and your tests are completely normal, you will receive your results only by: Marland Kitchen MyChart Message (if you have MyChart) OR . A paper copy in the mail If you have any lab test that is abnormal or we need to change your treatment, we will call you to review the results.  Testing/Procedures: R heart cath     [address] [place], [place] [address] Dept: [phone] Loc: [phone]  Roger Austin  11/13/2018  You are scheduled for a Cardiac Catheterization on Monday, September 28 with Dr. [name].  1. Please arrive at the medical mall of [facility] at 8:30 AM (This time is one hour before your procedure to ensure your preparation). Free valet parking service is available.   Special note: Every effort is made to have your procedure done on time. Please understand that emergencies sometimes delay scheduled procedures.  2. Diet: Do not eat solid foods after midnight.  The patient may have clear liquids until 5am upon the day of the procedure.  3. Labs: You will have active labs in chart from ortho surgery. 4. Medication instructions in preparation for your procedure:  On the morning of your procedure, take your Aspirin and Plavix/Clopidogrel and  any morning medicines NOT listed above.  You may use sips of water.  5. Plan for one night stay--bring personal belongings. 6. Bring a current list of your medications and current insurance cards. 7. You MUST have a responsible person to drive you home. 8. Someone MUST be with you the first 24 hours after you arrive home or your discharge will be delayed. 9. Please wear clothes that are easy to get on and off and wear slip-on shoes.  Thank you for allowing [place] to care for you!   -- [facility]   Follow-Up: At [facility], you and your health needs are our priority.  As part of our continuing mission to provide you with exceptional heart care, we have created designated Provider Care Teams.  These Care Teams include your primary Cardiologist (physician) and Advanced Practice Providers (APPs -  Physician Assistants and Nurse Practitioners) who all work together to provide you with the care you need, when you need it. You will need a follow up appointment in 6 weeks.   You may see [name], MD or Murray Hodgkins, NP.    We have placed a new patient ref to EP.
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
## 2018-11-13 NOTE — Addendum Note (Signed)
Addended by: Rogelia Mire on: 11/13/2018 06:39 PM   Modules accepted: Orders, SmartSet
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
## 2018-11-14 ENCOUNTER — Encounter
Admission: RE | Admit: 2018-11-14 | Discharge: 2018-11-14 | Disposition: A | Discharge status: Home / Self Care | Payer: Medicare HMO | Source: Ambulatory Visit | Attending: Orthopedic Surgery | Admitting: Orthopedic Surgery

## 2018-11-14 DIAGNOSIS — Z01812 Encounter for preprocedural laboratory examination: Secondary | ICD-10-CM | POA: Insufficient documentation

## 2018-11-14 HISTORY — DX: Dyspnea, unspecified: R06.00

## 2018-11-14 HISTORY — DX: Unspecified osteoarthritis, unspecified site: M19.90

## 2018-11-14 HISTORY — DX: Sleep apnea, unspecified: G47.30

## 2018-11-14 HISTORY — DX: Malignant (primary) neoplasm, unspecified: C80.1

## 2018-11-14 HISTORY — DX: Cerebral infarction, unspecified: I63.9

## 2018-11-14 LAB — CBC
HCT: 44.6 % (ref 39.0–52.0)
Hemoglobin: 14.8 g/dL (ref 13.0–17.0)
MCH: 30.6 pg (ref 26.0–34.0)
MCHC: 33.2 g/dL (ref 30.0–36.0)
MCV: 92.3 fL (ref 80.0–100.0)
Platelets: 212 10*3/uL (ref 150–400)
RBC: 4.83 MIL/uL (ref 4.22–5.81)
RDW: 13.6 % (ref 11.5–15.5)
WBC: 8.7 10*3/uL (ref 4.0–10.5)
nRBC: 0 % (ref 0.0–0.2)

## 2018-11-14 LAB — BASIC METABOLIC PANEL
Anion gap: 11 (ref 5–15)
BUN: 34 mg/dL — ABNORMAL HIGH (ref 8–23)
CO2: 26 mmol/L (ref 22–32)
Calcium: 9.2 mg/dL (ref 8.9–10.3)
Chloride: 104 mmol/L (ref 98–111)
Creatinine, Ser: 1.45 mg/dL — ABNORMAL HIGH (ref 0.61–1.24)
GFR calc Af Amer: 58 mL/min — ABNORMAL LOW (ref >60)
GFR calc non Af Amer: 50 mL/min — ABNORMAL LOW (ref >60)
Glucose, Bld: 82 mg/dL (ref 70–99)
Potassium: 4 mmol/L (ref 3.5–5.1)
Sodium: 141 mmol/L (ref 135–145)

## 2018-11-14 NOTE — Patient Instructions (Signed)
Your procedure is scheduled on: 11-28-18 THURSDAY Report to Same Day Surgery 2nd floor medical mall [facility] Entrance-take elevator on left to 2nd floor.  Check in with surgery information desk.) To find out your arrival time please call [phone] between 1PM - 3PM on 11-27-18 [facility]  Remember: Instructions that are not followed completely may result in serious medical risk, up to and including death, or upon the discretion of your surgeon and anesthesiologist your surgery may need to be rescheduled.    _x___ 1. Do not eat food after midnight the night before your procedure. NO GUM OR CANDY AFTER MIDNIGHT. You may drink clear liquids up to 2 hours before you are scheduled to arrive at the hospital for your procedure.  Do not drink clear liquids within 2 hours of your scheduled arrival to the hospital.  Clear liquids include  --Water or Apple juice without pulp  --Clear carbohydrate beverage such as ClearFast or Gatorade  --Black Coffee or Clear Tea (No milk, no creamers, do not add anything to the coffee or Tea   ____Ensure clear carbohydrate drink on the way to the hospital for bariatric patients  ____Ensure clear carbohydrate drink 3 hours before surgery.     __x__ 2. No Alcohol for 24 hours before or after surgery.   __x__3. No Smoking or e-cigarettes for 24 prior to surgery.  Do not use any chewable tobacco products for at least 6 hour prior to surgery   ____  4. Bring all medications with you on the day of surgery if instructed.    __x__ 5. Notify your doctor if there is any change in your medical condition     (cold, fever, infections).    x___6. On the morning of surgery brush your teeth with toothpaste and water.  You may rinse your mouth with mouth wash if you wish.  Do not swallow any toothpaste or mouthwash.   Do not wear jewelry, make-up, hairpins, clips or nail polish.  Do not wear lotions, powders, or perfumes. You may wear deodorant.  Do not shave 48 hours  prior to surgery. Men may shave face and neck.  Do not bring valuables to the hospital.    [facility] is not responsible for any belongings or valuables.               Contacts, dentures or bridgework may not be worn into surgery.  Leave your suitcase in the car. After surgery it may be brought to your room.  For patients admitted to the hospital, discharge time is determined by your treatment team.  _  Patients discharged the day of surgery will not be allowed to drive home.  You will need someone to drive you home and stay with you the night of your procedure.    Please read over the following fact sheets that you were given:   [facility] Preparing for Surgery    _x___ TAKE THE FOLLOWING MEDICATION THE MORNING OF SURGERY WITH A SMALL SIP OF WATER. These include:  1. ALLOPURINOL (ZYLOPRIM)  2. GABAPENTIN  (NEURONITIN)  3. ZOLOFT (SERTRALINE)  4.  5.  6.  ____Fleets enema or Magnesium Citrate as directed.   _x___ Use CHG Soap or sage wipes as directed on instruction sheet   _X___  Bring ALBUTEROL INHALER to hospital day of surgery  ____ Stop Metformin and Janumet 2 days prior to surgery.    ____ Take 1/2 of usual insulin dose the night before surgery and none on the morning  surgery.   _x___ Follow recommendations from Cardiologist, Pulmonologist or PCP regarding stopping Aspirin, Coumadin, Plavix ,Eliquis, Effient, or Pradaxa, and Pletal-PER CARDIOLOGIST, STOP YOUR ASPIRIN 4 DAYS PRIOR TO SURGERY-LAST DOSE ON  Saturday 11-23-18  X____Stop Anti-inflammatories such as Advil, Aleve, Ibuprofen, Motrin, Naproxen, Naprosyn, Goodies powders or aspirin products. OK to take Tylenol and Celebrex.   ____ Stop supplements until after surgery.     ____ Bring C-Pap to the hospital.
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
## 2018-11-21 ENCOUNTER — Other Ambulatory Visit: Payer: Medicare HMO

## 2018-11-21 ENCOUNTER — Ambulatory Visit: Payer: PRIVATE HEALTH INSURANCE | Admitting: Cardiovascular Disease

## 2018-11-25 ENCOUNTER — Other Ambulatory Visit
Admission: RE | Admit: 2018-11-25 | Discharge: 2018-11-25 | Disposition: A | Discharge status: Home / Self Care | Payer: No Typology Code available for payment source | Source: Ambulatory Visit | Attending: Orthopedic Surgery | Admitting: Orthopedic Surgery

## 2018-11-25 ENCOUNTER — Other Ambulatory Visit: Payer: Self-pay

## 2018-11-25 DIAGNOSIS — Z20828 Contact with and (suspected) exposure to other viral communicable diseases: Secondary | ICD-10-CM | POA: Insufficient documentation

## 2018-11-25 DIAGNOSIS — Z01812 Encounter for preprocedural laboratory examination: Secondary | ICD-10-CM | POA: Diagnosis present

## 2018-11-25 LAB — SARS CORONAVIRUS 2 (TAT 6-24 HRS): SARS Coronavirus 2: NEGATIVE

## 2018-11-27 MED ORDER — CLINDAMYCIN PHOSPHATE 900 MG/50ML IV SOLN
900.0000 mg | Freq: Once | INTRAVENOUS | Status: AC
  Administered 2018-11-28: 900 mg via INTRAVENOUS
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
## 2018-11-27 NOTE — Pre-Procedure Instructions (Signed)
CARDIAC CLEARANCE IN CARE EVERYWHERE FROM DR Hardie Lora RISK

## 2018-11-28 ENCOUNTER — Other Ambulatory Visit: Payer: Self-pay

## 2018-11-28 ENCOUNTER — Ambulatory Visit: Payer: No Typology Code available for payment source | Admitting: Anesthesiology

## 2018-11-28 ENCOUNTER — Ambulatory Visit
Admission: RE | Admit: 2018-11-28 | Discharge: 2018-11-28 | Disposition: A | Discharge status: Home / Self Care | Payer: No Typology Code available for payment source | Attending: Orthopedic Surgery | Admitting: Orthopedic Surgery

## 2018-11-28 ENCOUNTER — Encounter
Admission: RE | Disposition: A | Discharge status: Home / Self Care | Payer: Medicare HMO | Source: Home / Self Care | Attending: Orthopedic Surgery

## 2018-11-28 ENCOUNTER — Encounter: Payer: Self-pay | Admitting: Emergency Medicine

## 2018-11-28 ENCOUNTER — Ambulatory Visit: Payer: No Typology Code available for payment source

## 2018-11-28 DIAGNOSIS — Z950 Presence of cardiac pacemaker: Secondary | ICD-10-CM | POA: Insufficient documentation

## 2018-11-28 DIAGNOSIS — G4733 Obstructive sleep apnea (adult) (pediatric): Secondary | ICD-10-CM | POA: Diagnosis not present

## 2018-11-28 DIAGNOSIS — X500XXA Overexertion from strenuous movement or load, initial encounter: Secondary | ICD-10-CM | POA: Diagnosis not present

## 2018-11-28 DIAGNOSIS — Z791 Long term (current) use of non-steroidal anti-inflammatories (NSAID): Secondary | ICD-10-CM | POA: Insufficient documentation

## 2018-11-28 DIAGNOSIS — Z79899 Other long term (current) drug therapy: Secondary | ICD-10-CM | POA: Diagnosis not present

## 2018-11-28 DIAGNOSIS — Z96691 Finger-joint replacement of right hand: Secondary | ICD-10-CM | POA: Diagnosis not present

## 2018-11-28 DIAGNOSIS — Z9119 Patient's noncompliance with other medical treatment and regimen: Secondary | ICD-10-CM | POA: Insufficient documentation

## 2018-11-28 DIAGNOSIS — M109 Gout, unspecified: Secondary | ICD-10-CM | POA: Insufficient documentation

## 2018-11-28 DIAGNOSIS — M792 Neuralgia and neuritis, unspecified: Secondary | ICD-10-CM | POA: Insufficient documentation

## 2018-11-28 DIAGNOSIS — Z96661 Presence of right artificial ankle joint: Secondary | ICD-10-CM | POA: Diagnosis not present

## 2018-11-28 DIAGNOSIS — Z85828 Personal history of other malignant neoplasm of skin: Secondary | ICD-10-CM | POA: Diagnosis not present

## 2018-11-28 DIAGNOSIS — I495 Sick sinus syndrome: Secondary | ICD-10-CM | POA: Diagnosis not present

## 2018-11-28 DIAGNOSIS — E785 Hyperlipidemia, unspecified: Secondary | ICD-10-CM | POA: Diagnosis not present

## 2018-11-28 DIAGNOSIS — Z955 Presence of coronary angioplasty implant and graft: Secondary | ICD-10-CM | POA: Diagnosis not present

## 2018-11-28 DIAGNOSIS — G4730 Sleep apnea, unspecified: Secondary | ICD-10-CM | POA: Insufficient documentation

## 2018-11-28 DIAGNOSIS — M75122 Complete rotator cuff tear or rupture of left shoulder, not specified as traumatic: Secondary | ICD-10-CM | POA: Diagnosis not present

## 2018-11-28 DIAGNOSIS — M7542 Impingement syndrome of left shoulder: Secondary | ICD-10-CM | POA: Diagnosis present

## 2018-11-28 DIAGNOSIS — S46812A Strain of other muscles, fascia and tendons at shoulder and upper arm level, left arm, initial encounter: Secondary | ICD-10-CM | POA: Insufficient documentation

## 2018-11-28 DIAGNOSIS — Z7902 Long term (current) use of antithrombotics/antiplatelets: Secondary | ICD-10-CM | POA: Insufficient documentation

## 2018-11-28 DIAGNOSIS — Z8673 Personal history of transient ischemic attack (TIA), and cerebral infarction without residual deficits: Secondary | ICD-10-CM | POA: Diagnosis not present

## 2018-11-28 DIAGNOSIS — I2510 Atherosclerotic heart disease of native coronary artery without angina pectoris: Secondary | ICD-10-CM | POA: Insufficient documentation

## 2018-11-28 DIAGNOSIS — G8929 Other chronic pain: Secondary | ICD-10-CM | POA: Diagnosis not present

## 2018-11-28 DIAGNOSIS — M549 Dorsalgia, unspecified: Secondary | ICD-10-CM | POA: Diagnosis not present

## 2018-11-28 DIAGNOSIS — Z7982 Long term (current) use of aspirin: Secondary | ICD-10-CM | POA: Insufficient documentation

## 2018-11-28 HISTORY — PX: SHOULDER ARTHROSCOPY WITH ROTATOR CUFF REPAIR: SHX5685

## 2018-11-28 SURGERY — ARTHROSCOPY, SHOULDER, WITH ROTATOR CUFF REPAIR
Anesthesia: General | Laterality: Left

## 2018-11-28 MED ORDER — MIDAZOLAM HCL 2 MG/2ML IJ SOLN
INTRAMUSCULAR | Status: AC
  Administered 2018-11-28: 13:00:00 1 mg via INTRAVENOUS
  Filled 2018-11-28: qty 2

## 2018-11-28 MED ORDER — DEXAMETHASONE SODIUM PHOSPHATE 10 MG/ML IJ SOLN
INTRAMUSCULAR | Status: DC | PRN
  Administered 2018-11-28: 4 mg via INTRAVENOUS

## 2018-11-28 MED ORDER — BUPIVACAINE HCL (PF) 0.5 % IJ SOLN
INTRAMUSCULAR | Status: AC
  Filled 2018-11-28: qty 10

## 2018-11-28 MED ORDER — ROCURONIUM BROMIDE 100 MG/10ML IV SOLN
INTRAVENOUS | Status: DC | PRN
  Administered 2018-11-28: 40 mg via INTRAVENOUS
  Administered 2018-11-28: 10 mg via INTRAVENOUS

## 2018-11-28 MED ORDER — FENTANYL CITRATE (PF) 100 MCG/2ML IJ SOLN
25.0000 ug | INTRAMUSCULAR | Status: DC | PRN
  Administered 2018-11-28 (×4): 25 ug via INTRAVENOUS

## 2018-11-28 MED ORDER — PROMETHAZINE HCL 25 MG/ML IJ SOLN: 6.2500 mg | INTRAMUSCULAR | Status: DC | PRN

## 2018-11-28 MED ORDER — LACTATED RINGERS IV SOLN
INTRAVENOUS | Status: DC
  Administered 2018-11-28 (×2): via INTRAVENOUS

## 2018-11-28 MED ORDER — PROPOFOL 10 MG/ML IV BOLUS
INTRAVENOUS | Status: DC | PRN
  Administered 2018-11-28: 150 mg via INTRAVENOUS

## 2018-11-28 MED ORDER — FENTANYL CITRATE (PF) 100 MCG/2ML IJ SOLN
INTRAMUSCULAR | Status: DC | PRN
  Administered 2018-11-28 (×2): 50 ug via INTRAVENOUS

## 2018-11-28 MED ORDER — HYDRALAZINE HCL 20 MG/ML IJ SOLN
INTRAMUSCULAR | Status: AC
  Filled 2018-11-28: qty 1

## 2018-11-28 MED ORDER — SUGAMMADEX SODIUM 200 MG/2ML IV SOLN
INTRAVENOUS | Status: DC | PRN
  Administered 2018-11-28: 200 mg via INTRAVENOUS

## 2018-11-28 MED ORDER — MIDAZOLAM HCL 2 MG/2ML IJ SOLN
INTRAMUSCULAR | Status: DC | PRN
  Administered 2018-11-28: 2 mg via INTRAVENOUS

## 2018-11-28 MED ORDER — CLINDAMYCIN PHOSPHATE 900 MG/50ML IV SOLN
INTRAVENOUS | Status: AC
  Filled 2018-11-28: qty 50

## 2018-11-28 MED ORDER — LACTATED RINGERS IV SOLN
INTRAVENOUS | Status: DC | PRN
  Administered 2018-11-28: 4 mL

## 2018-11-28 MED ORDER — LIDOCAINE-EPINEPHRINE 1 %-1:100000 IJ SOLN
INTRAMUSCULAR | Status: AC
  Filled 2018-11-28: qty 1

## 2018-11-28 MED ORDER — ONDANSETRON 4 MG PO TBDP: 4.0000 mg | ORAL_TABLET | Freq: Three times a day (TID) | ORAL | 0 refills | Status: DC | PRN

## 2018-11-28 MED ORDER — MIDAZOLAM HCL 2 MG/2ML IJ SOLN
INTRAMUSCULAR | Status: AC
  Filled 2018-11-28: qty 2

## 2018-11-28 MED ORDER — OXYCODONE HCL 5 MG PO TABS: 5.0000 mg | ORAL_TABLET | ORAL | 0 refills | Status: DC | PRN

## 2018-11-28 MED ORDER — ONDANSETRON HCL 4 MG/2ML IJ SOLN
INTRAMUSCULAR | Status: DC | PRN
  Administered 2018-11-28: 4 mg via INTRAVENOUS

## 2018-11-28 MED ORDER — FAMOTIDINE 20 MG PO TABS
ORAL_TABLET | ORAL | Status: AC
  Administered 2018-11-28: 20 mg via ORAL
  Filled 2018-11-28: qty 1

## 2018-11-28 MED ORDER — MIDAZOLAM HCL 2 MG/2ML IJ SOLN
1.0000 mg | Freq: Once | INTRAMUSCULAR | Status: AC
  Administered 2018-11-28: 13:00:00 1 mg via INTRAVENOUS

## 2018-11-28 MED ORDER — FENTANYL CITRATE (PF) 100 MCG/2ML IJ SOLN
INTRAMUSCULAR | Status: AC
  Filled 2018-11-28: qty 2

## 2018-11-28 MED ORDER — FENTANYL CITRATE (PF) 100 MCG/2ML IJ SOLN
50.0000 ug | Freq: Once | INTRAMUSCULAR | Status: AC
  Administered 2018-11-28: 13:00:00 50 ug via INTRAVENOUS

## 2018-11-28 MED ORDER — LIDOCAINE HCL (PF) 1 % IJ SOLN
INTRAMUSCULAR | Status: AC
  Filled 2018-11-28: qty 5

## 2018-11-28 MED ORDER — HYDRALAZINE HCL 20 MG/ML IJ SOLN
10.0000 mg | Freq: Once | INTRAMUSCULAR | Status: AC
  Administered 2018-11-28: 17:00:00 10 mg via INTRAVENOUS

## 2018-11-28 MED ORDER — EPINEPHRINE PF 1 MG/ML IJ SOLN
INTRAMUSCULAR | Status: AC
  Filled 2018-11-28: qty 4

## 2018-11-28 MED ORDER — LIDOCAINE-EPINEPHRINE 1 %-1:100000 IJ SOLN
INTRAMUSCULAR | Status: DC | PRN
  Administered 2018-11-28: 10 mL

## 2018-11-28 MED ORDER — SODIUM CHLORIDE 0.9 % IV SOLN
INTRAVENOUS | Status: DC | PRN
  Administered 2018-11-28: 14:00:00 30 ug/min via INTRAVENOUS

## 2018-11-28 MED ORDER — LIDOCAINE HCL (CARDIAC) PF 100 MG/5ML IV SOSY
PREFILLED_SYRINGE | INTRAVENOUS | Status: DC | PRN
  Administered 2018-11-28: 60 mg via INTRAVENOUS

## 2018-11-28 MED ORDER — FENTANYL CITRATE (PF) 100 MCG/2ML IJ SOLN
INTRAMUSCULAR | Status: AC
  Administered 2018-11-28: 17:00:00 25 ug via INTRAVENOUS
  Filled 2018-11-28: qty 2

## 2018-11-28 MED ORDER — ASPIRIN EC 325 MG PO TBEC: 325.0000 mg | DELAYED_RELEASE_TABLET | Freq: Every day | ORAL | 0 refills | Status: AC

## 2018-11-28 MED ORDER — ACETAMINOPHEN 500 MG PO TABS: 1000.0000 mg | ORAL_TABLET | Freq: Three times a day (TID) | ORAL | 2 refills | Status: DC

## 2018-11-28 MED ORDER — PHENYLEPHRINE HCL (PRESSORS) 10 MG/ML IV SOLN
INTRAVENOUS | Status: DC | PRN
  Administered 2018-11-28: 100 ug via INTRAVENOUS
  Administered 2018-11-28 (×3): 200 ug via INTRAVENOUS
  Administered 2018-11-28: 100 ug via INTRAVENOUS

## 2018-11-28 MED ORDER — FAMOTIDINE 20 MG PO TABS
20.0000 mg | ORAL_TABLET | Freq: Once | ORAL | Status: AC
  Administered 2018-11-28: 11:00:00 20 mg via ORAL

## 2018-11-28 MED ORDER — BUPIVACAINE LIPOSOME 1.3 % IJ SUSP
INTRAMUSCULAR | Status: AC
  Filled 2018-11-28: qty 20

## 2018-11-28 MED ORDER — FENTANYL CITRATE (PF) 100 MCG/2ML IJ SOLN
INTRAMUSCULAR | Status: AC
  Administered 2018-11-28: 50 ug via INTRAVENOUS
  Filled 2018-11-28: qty 2

## 2018-11-28 SURGICAL SUPPLY — 79 items
ADAPTER IRRIG TUBE 2 SPIKE SOL (ADAPTER) ×4 IMPLANT
ANCHOR 2.3 SP SGL 1.2 XBRAID (Anchor) ×2 IMPLANT
ANCHOR SUT BIO SW 4.75X19.1 (Anchor) ×2 IMPLANT
BLADE OSCILLATING/SAGITTAL (BLADE)
BLADE SW THK.38XMED LNG THN (BLADE) IMPLANT
BUR BR 5.5 12 FLUTE (BURR) ×1 IMPLANT
BUR RADIUS 4.0X18.5 (BURR) IMPLANT
CANNULA PART THRD DISP 5.75X7 (CANNULA) IMPLANT
CANNULA PARTIAL THREAD 2X7 (CANNULA) IMPLANT
CANNULA TWIST IN 8.25X9CM (CANNULA) IMPLANT
CHLORAPREP W/TINT 26 (MISCELLANEOUS) ×2 IMPLANT
COOLER POLAR GLACIER W/PUMP (MISCELLANEOUS) ×2 IMPLANT
COVER LIGHT HANDLE STERIS (MISCELLANEOUS) ×2 IMPLANT
COVER WAND RF STERILE (DRAPES) ×2 IMPLANT
CRADLE LAMINECT ARM (MISCELLANEOUS) ×4 IMPLANT
DERMABOND ADVANCED (GAUZE/BANDAGES/DRESSINGS)
DERMABOND ADVANCED .7 DNX12 (GAUZE/BANDAGES/DRESSINGS) IMPLANT
DRAPE 3/4 80X56 (DRAPES) ×2 IMPLANT
DRAPE INCISE IOBAN 66X45 STRL (DRAPES) ×2 IMPLANT
DRAPE SPLIT 6X30 W/TAPE (DRAPES) ×4 IMPLANT
DRAPE STERI 35X30 U-POUCH (DRAPES) ×2 IMPLANT
DRAPE U-SHAPE 47X51 STRL (DRAPES) ×2 IMPLANT
ELECT REM PT RETURN 9FT ADLT (ELECTROSURGICAL) ×2
ELECTRODE REM PT RTRN 9FT ADLT (ELECTROSURGICAL) ×1 IMPLANT
GAUZE SPONGE 4X4 12PLY STRL (GAUZE/BANDAGES/DRESSINGS) ×2 IMPLANT
GAUZE XEROFORM 1X8 LF (GAUZE/BANDAGES/DRESSINGS) ×2 IMPLANT
GLOVE BIOGEL PI IND STRL 8 (GLOVE) ×1 IMPLANT
GLOVE BIOGEL PI INDICATOR 8 (GLOVE) ×1
GLOVE SURG SYN 8.0 (GLOVE) ×6 IMPLANT
GLOVE SURG SYN 8.0 PF PI (GLOVE) ×1 IMPLANT
GOWN STRL REUS W/ TWL LRG LVL3 (GOWN DISPOSABLE) ×1 IMPLANT
GOWN STRL REUS W/TWL LRG LVL3 (GOWN DISPOSABLE) ×3
GOWN STRL REUS W/TWL LRG LVL4 (GOWN DISPOSABLE) ×2 IMPLANT
IV LACTATED RINGER IRRG 3000ML (IV SOLUTION) ×4
IV LR IRRIG 3000ML ARTHROMATIC (IV SOLUTION) ×4 IMPLANT
KIT STABILIZATION SHOULDER (MISCELLANEOUS) ×2 IMPLANT
KIT TURNOVER KIT A (KITS) ×2 IMPLANT
MANIFOLD NEPTUNE II (INSTRUMENTS) ×2 IMPLANT
MASK FACE SPIDER DISP (MASK) ×2 IMPLANT
MAT ABSORB  FLUID 56X50 GRAY (MISCELLANEOUS) ×2
MAT ABSORB FLUID 56X50 GRAY (MISCELLANEOUS) ×2 IMPLANT
NDL MAYO 6 CRC TAPER PT (NEEDLE) IMPLANT
NDL SAFETY ECLIPSE 18X1.5 (NEEDLE) ×1 IMPLANT
NDL SCORPION MULTI FIRE (NEEDLE) IMPLANT
NEEDLE HYPO 18GX1.5 SHARP (NEEDLE)
NEEDLE HYPO 22GX1.5 SAFETY (NEEDLE) ×2 IMPLANT
NEEDLE MAYO 6 CRC TAPER PT (NEEDLE) IMPLANT
NEEDLE SCORPION MULTI FIRE (NEEDLE) ×2 IMPLANT
PACK ARTHROSCOPY SHOULDER (MISCELLANEOUS) ×2 IMPLANT
PAD ABD DERMACEA PRESS 5X9 (GAUZE/BANDAGES/DRESSINGS) ×2 IMPLANT
PAD WRAPON POLAR SHDR XLG (MISCELLANEOUS) ×1 IMPLANT
SET TUBE SUCT SHAVER OUTFL 24K (TUBING) ×2 IMPLANT
SET TUBE TIP INTRA-ARTICULAR (MISCELLANEOUS) ×2 IMPLANT
SLING ULTRA II M (MISCELLANEOUS) ×1 IMPLANT
STAPLER SKIN PROX 35W (STAPLE) IMPLANT
STRAP SAFETY 5IN WIDE (MISCELLANEOUS) ×2 IMPLANT
STRIP CLOSURE SKIN 1/2X4 (GAUZE/BANDAGES/DRESSINGS) IMPLANT
SUT ETHILON 3-0 (SUTURE) IMPLANT
SUT ETHILON 4-0 (SUTURE) ×1
SUT ETHILON 4-0 FS2 18XMFL BLK (SUTURE) ×1
SUT LASSO 90 DEG SD STR (SUTURE) IMPLANT
SUT MNCRL 4-0 (SUTURE)
SUT MNCRL 4-0 27XMFL (SUTURE)
SUT PROLENE 0 CT 2 (SUTURE) ×1 IMPLANT
SUT TICRON 2-0 30IN 311381 (SUTURE) IMPLANT
SUT VIC AB 0 CT1 36 (SUTURE) ×1 IMPLANT
SUT VIC AB 2-0 CT2 27 (SUTURE) ×2 IMPLANT
SUT VICRYL 3-0 27IN (SUTURE) IMPLANT
SUTURE ETHLN 4-0 FS2 18XMF BLK (SUTURE) ×1 IMPLANT
SUTURE MNCRL 4-0 27XMF (SUTURE) IMPLANT
SUTURE TAPE 1.3 40 TPR END (SUTURE) IMPLANT
SUTURETAPE 1.3 40 TPR END (SUTURE) ×6
SYR 10ML LL (SYRINGE) ×2 IMPLANT
TAPE CLOTH 3X10 WHT NS LF (GAUZE/BANDAGES/DRESSINGS) ×2 IMPLANT
TAPE MICROFOAM 4IN (TAPE) ×2 IMPLANT
TUBING ARTHRO INFLOW-ONLY STRL (TUBING) ×2 IMPLANT
TUBING CONNECTING 10 (TUBING) ×2 IMPLANT
WAND WEREWOLF FLOW 90D (MISCELLANEOUS) ×2 IMPLANT
WRAPON POLAR PAD SHDR XLG (MISCELLANEOUS) ×2

## 2018-11-28 NOTE — Anesthesia Procedure Notes (Signed)
Procedure Name: Intubation Date/Time: 11/28/2018 2:15 PM Performed by: Nelda Marseille, CRNA Pre-anesthesia Checklist: Patient identified, Patient being monitored, Timeout performed, Emergency Drugs available and Suction available Patient Re-evaluated:Patient Re-evaluated prior to induction Oxygen Delivery Method: Circle system utilized Preoxygenation: Pre-oxygenation with 100% oxygen Induction Type: IV induction Ventilation: Mask ventilation without difficulty Laryngoscope Size: Mac, 3 and McGraph Grade View: Grade III Tube type: Oral Tube size: 7.5 mm Number of attempts: 1 Airway Equipment and Method: Stylet and Video-laryngoscopy Placement Confirmation: ETT inserted through vocal cords under direct vision,  positive ETCO2 and breath sounds checked- equal and bilateral Secured at: 21 cm Tube secured with: Tape Dental Injury: Teeth and Oropharynx as per pre-operative assessment  Difficulty Due To: Difficulty was anticipated and Difficult Airway- due to anterior larynx

## 2018-11-28 NOTE — Op Note (Addendum)
SURGERY DATE: 11/28/2018  PRE-OP DIAGNOSIS:  1. Left subacromial impingement 2. Left chronic proximal biceps rupture 3. Left rotator cuff tear   POST-OP DIAGNOSIS: 1. Left subacromial impingement 2. Left chronic proximal biceps rupture 3. Left rotator cuff tear  PROCEDURES:  1. Left mini-open rotator cuff repair 2. Left arthroscopic extensive debridement of shoulder (glenohumeral and subacromial spaces) 3. Left arthroscopic subacromial decompression  SURGEON: Cato Mulligan, MD  ASSISTANT: Anitra Lauth, PA  ANESTHESIA: Gen with Exparil interscalene block  ESTIMATED BLOOD LOSS: 25cc  DRAINS:  none  TOTAL IV FLUIDS: per anesthesia   SPECIMENS: none  IMPLANTS:  - Arthrex 4.64mm SwiveLock x2 - Iconix SPEED double loaded with 1.2 and 2.41mm tape x 3   OPERATIVE FINDINGS:  Examination under anesthesia: A careful examination under anesthesia was performed.  Passive range of motion was: FF: 160; ER at side: 45; ER in abduction: 90; IR in abduction: 60.  Anterior load shift: NT.  Posterior load shift: NT.  Sulcus in neutral: NT.  Sulcus in ER: NT.    Intra-operative findings: A thorough arthroscopic examination of the shoulder was performed.  The findings are: 1. Biceps tendon: Proximal stump of biceps tendon visualized, remainder of tendon not visualized in the joint 2. Superior labrum: injected with surrounding synovitis 3. Posterior labrum and capsule: normal 4. Inferior capsule and inferior recess: normal 5. Glenoid cartilage surface: Grade 2 changes centrally and anteriorly 6. Supraspinatus attachment: full-thickness tear 7. Posterior rotator cuff attachment: Anterior infraspinatus tear 8. Humeral head articular cartilage: Grade 1-2 degenerative changes 9. Rotator interval: significant synovitis 10: Subscapularis tendon: attachment intact 11. Anterior labrum: degenerative 12. IGHL: normal  OPERATIVE REPORT:   Indications for procedure: Roger Austin is a 63  y.o. male with ~3 months of L shoulder pain that has failed non-operative management including activity modification, physical therapy, medical management and corticosteroid injection without adequate relief of symptoms. Clinical exam and CT arthrogram were suggestive of rotator cuff tear, chronic proximal biceps rupture, and subacromial impingement, and acromioclavicular joint arthritis. After discussion of risks, benefits, and alternatives to surgery, the patient elected to proceed.   Procedure in detail:  I identified Roger Austin in the pre-operative holding area.  I marked the operative shoulder with my initials. I reviewed the risks and benefits of the proposed surgical intervention, and the patient (and/or patient's guardian) wished to proceed.  Anesthesia was then performed with an Exparil interscalene block.  The patient was transferred to the operative suite and placed in the beach chair position.    Appropriate IV antibiotics were administered prior to incision. The operative upper extremity was then prepped and draped in standard fashion. A time out was performed confirming the correct extremity, correct patient, and correct procedure.   I then created a standard posterior portal with an 11 blade. The glenohumeral joint was easily entered with a blunt trochar and the arthroscope introduced. The findings of diagnostic arthroscopy are described above. I debrided degenerative tissue including the synovitic tissue about the rotator interval and anterior and superior labrum. I then coagulated the inflamed synovium to obtain hemostasis and reduce the risk of post-operative swelling using an Arthrocare radiofrequency device. I used a motorized shaver to debride the stump of the biceps tendon back to a stable base.   Next, the arthroscope was then introduced into the subacromial space. A direct lateral portal was created with an 11-blade after spinal needle localization. An extensive subacromial  bursectomy was performed using a combination of the shaver  and Arthrocare wand. The entire acromial undersurface was exposed and the CA ligament was subperiosteally elevated to expose the anterior acromial hook. A 5.89mm barrel burr was used to create a flat anterior and lateral aspect of the acromion, converting it from a Type 2 to a Type 1 acromion. Care was made to keep the deltoid fascia intact.  A longitudinal incision from the anterolateral acromion ~6cm in length was made overlying the raphe between the anterior and middle heads of the deltoid. The raphe was identified and it was incised. The subacromial space was identified. Any remaining bursa was excised. The rotator cuff tear was identified. It was an L-shaped tear with the long limb of the L anterior.   The arm was then internally rotated.  The rotator cuff footprint was cleared of soft tissue. A rongeur was used to gently decorticate the rotator cuff footprint to allow for improved healing.  3 Iconix SPEED anchors were placed just lateral to the articular margin.  The rotator cuff was mobilized using key elevators both superior and inferior to the tear.  4 margin convergence sutures were placed with suture tape.  At this point, the rotator cuff was able to be easily reduced to its footprint and then held in a reduced position with graspers.  All 4 limbs of the anterior anchor were passed through the anterior aspect of the rotator cuff.  The 1.2 mm tapes from the middle medial row anchor were passed also in a margin convergence fashion.  The remainder of the 6 strands of sutures were passed to the posterior aspect of the rotator cuff.  The 1.2 mm tapes from the middle medial row anchor were tied further reapproximating the rotator cuff to its footprint.  The sutures were then cut. Two SwiveLock anchors were placed for the lateral row anchors with one limb of each of the remaining 5 medial row sutures passed through each anchor.  This allowed for  reapproximation of the rotator cuff over its footprint. The anterior anchor #2 FiberWire from the SwiveLock anchor was additionally passed through the anterior aspect of the rotator cuff to further reapproximate the tear. This construct was stable with external and internal rotation and allowed for appropriate reapproximation of the rotator cuff to its natural footprint.  The wound was thoroughly irrigated.  The deltoid split was closed with 0 Vicryl.  The subdermal layer was closed with 2-0 Vicryl.  The skin was closed with 4-0 Monocryl and Dermabond. The portals were closed with 3-0 Nylon. Xeroform was applied to the incisions. A sterile dressing was applied, followed by a Polar Care sleeve and a SlingShot shoulder immobilizer/sling. The patient was awakened from anesthesia without difficulty and was transferred to the PACU in stable condition.   Of note, assistance from a PA was essential to performing the surgery.  PA was present for the entire surgery.  PA assisted with patient positioning, retraction, instrumentation, and wound closure. The surgery would have been more difficult and had longer operative time without PA assistance.    COMPLICATIONS: none  DISPOSITION: plan for discharge home after recovery in PACU   POSTOPERATIVE PLAN: Remain in sling (except hygiene and elbow/wrist/hand RoM exercises as instructed by PT) x 6 weeks and NWB for this time. PT to begin 3-4 days after surgery. Rotator cuff repair and biceps tenodesis rehab protocol. ASA 325mg  daily x 2 weeks for DVT ppx.

## 2018-11-28 NOTE — Discharge Instructions (Signed)
AMBULATORY SURGERY  DISCHARGE INSTRUCTIONS   1) The drugs that you were given will stay in your system until tomorrow so for the next 24 hours you should not:  A) Drive an automobile B) Make any legal decisions C) Drink any alcoholic beverage   2) You may resume regular meals tomorrow.  Today it is better to start with liquids and gradually work up to solid foods.  You may eat anything you prefer, but it is better to start with liquids, then soup and crackers, and gradually work up to solid foods.   3) Please notify your doctor immediately if you have any unusual bleeding, trouble breathing, redness and pain at the surgery site, drainage, fever, or pain not relieved by medication.    4) Additional Instructions:        Please contact your physician with any problems or Same Day Surgery at [phone], Monday through Friday 6 am to 4 pm, or [facility] number at [phone].  Post-Op Instructions - Rotator Cuff Repair  1. Bracing: You will wear a shoulder immobilizer or sling for 6 weeks.   2. Driving: No driving for 3 weeks post-op. When driving, do not wear the immobilizer. Ideally, we recommend no driving for 6 weeks while sling is in place as one arm will be immobilized.   3. Activity: No active lifting for 2 months. Wrist, hand, and elbow motion only. Avoid lifting the upper arm away from the body except for hygiene. You are permitted to bend and straighten the elbow passively only (no active elbow motion). You may use your hand and wrist for typing, writing, and managing utensils (cutting food). Do not lift more than a coffee cup for 8 weeks.  When sleeping or resting, inclined positions (recliner chair or wedge pillow) and a pillow under the forearm for support may provide better comfort for up to 4 weeks.  Avoid long distance travel for 4 weeks.  Return to normal activities after rotator cuff repair repair normally takes 6 months on average. If rehab  goes very well, may be able to do most activities at 4 months, except overhead or contact sports.  4. Physical Therapy: Begins 3-4 days after surgery, and proceed 1 time per week for the first 6 weeks, then 1-2 times per week from weeks 6-20 post-op.  5. Medications:  - You will be provided a prescription for narcotic pain medicine. After surgery, take 1-2 narcotic tablets every 4 hours if needed for severe pain.  - A prescription for anti-nausea medication will be provided in case the narcotic medicine causes nausea - take 1 tablet every 6 hours only if nauseated.   - Take tylenol 1000 mg (2 Extra Strength tablets or 3 regular strength) every 8 hours for pain.  May decrease or stop tylenol 5 days after surgery if you are having minimal pain. - Take ASA 325mg /day x 2 weeks to help prevent DVTs/PEs (blood clots).  - DO NOT take ANY nonsteroidal anti-inflammatory pain medications (Advil, Motrin, Ibuprofen, Aleve, Naproxen, or Naprosyn). These medicines can inhibit healing of your shoulder repair.    If you are taking prescription medication for anxiety, depression, insomnia, muscle spasm, chronic pain, or for attention deficit disorder, you are advised that you are at a higher risk of adverse effects with use of narcotics post-op, including narcotic addiction/dependence, depressed breathing, death. If you use non-prescribed substances: alcohol, marijuana, cocaine, heroin, methamphetamines, etc., you are at a higher risk of adverse effects with use of narcotics  post-op, including narcotic addiction/dependence, depressed breathing, death. You are advised that taking > 50 morphine milligram equivalents (MME) of narcotic pain medication per day results in twice the risk of overdose or death. For your prescription provided: oxycodone 5 mg - taking more than 6 tablets per day would result in > 50 morphine milligram equivalents (MME) of narcotic pain medication. Be advised that we will prescribe narcotics  short-term, for acute post-operative pain only - 3 weeks for major operations such as shoulder repair/reconstruction surgeries.     6. Post-Op Appointment:  Your first post-op appointment will be 10-14 days post-op.  7. Work or School: For most, but not all procedures, we advise staying out of work or school for at least 1 to 2 weeks in order to recover from the stress of surgery and to allow time for healing.   If you need a work or school note this can be provided.   8. Smoking: If you are a smoker, you need to refrain from smoking in the postoperative period. The nicotine in cigarettes will inhibit healing of your shoulder repair and decrease the chance of successful repair. Similarly, nicotine containing products (gum, patches) should be avoided.   Post-operative Brace: Apply and remove the brace you received as you were instructed to at the time of fitting and as described in detail as the braces instructions for use indicate.  Wear the brace for the period of time prescribed by your physician.  The brace can be cleaned with soap and water and allowed to air dry only.  Should the brace result in increased pain, decreased feeling (numbness/tingling), increased swelling or an overall worsening of your medical condition, please contact your doctor immediately.  If an emergency situation occurs as a result of wearing the brace after normal business hours, please dial 911 and seek immediate medical attention.  Let your doctor know if you have any further questions about the brace issued to you. Refer to the shoulder sling instructions for use if you have any questions regarding the correct fit of your shoulder sling.  [facility]: [phone]  Video that illustrates how to properly use a shoulder sling: "Instructions for Proper Use of an Orthopaedic Sling" [email or web address]

## 2018-11-28 NOTE — Anesthesia Post-op Follow-up Note (Signed)
Anesthesia QCDR form completed.        

## 2018-11-28 NOTE — H&P (Signed)
Paper H&P to be scanned into permanent record. H&P reviewed. No significant changes noted.  

## 2018-11-28 NOTE — Transfer of Care (Signed)
Immediate Anesthesia Transfer of Care Note  Patient: Roger Austin  Procedure(s) Performed: SHOULDER ARTHROSCOPY WITH MINI ROTATOR CUFF REPAIR (Left )  Patient Location: PACU  Anesthesia Type:General  Level of Consciousness: drowsy  Airway & Oxygen Therapy: Patient Spontanous Breathing and Patient connected to face mask oxygen  Post-op Assessment: Report given to RN and Post -op Vital signs reviewed and stable  Post vital signs: stable  Last Vitals:  Vitals Value Taken Time  BP 155/99 11/28/18 1632  Temp 36.1 C 11/28/18 1632  Pulse 59 11/28/18 1635  Resp 17 11/28/18 1635  SpO2 96 % 11/28/18 1635  Vitals shown include unvalidated device data.  Last Pain:  Vitals:   11/28/18 1113  TempSrc: Temporal  PainSc: 3          Complications: No apparent anesthesia complications

## 2018-11-28 NOTE — Anesthesia Preprocedure Evaluation (Signed)
Anesthesia Evaluation  Patient identified by MRN, date of birth, ID band Patient awake    Reviewed: Allergy & Precautions, NPO status , Patient's Chart, lab work & pertinent test results  History of Anesthesia Complications Negative for: history of anesthetic complications  Airway Mallampati: III  TM Distance: <3 FB Neck ROM: Full    Dental  (+) Chipped, Dental Advidsory Given, Caps, Missing   Pulmonary neg shortness of breath, sleep apnea , neg COPD, neg recent URI,    Pulmonary exam normal        Cardiovascular (-) hypertension(-) angina+ CAD and + Cardiac Stents  (-) Past MI and (-) CABG Normal cardiovascular exam(-) dysrhythmias + pacemaker + Valvular Problems/Murmurs   [employer]tr[employer] pacemaker... Hx of sick sinus syndrome   Neuro/Psych neg Seizures PSYCHIATRIC DISORDERS Anxiety CVA, No Residual Symptoms    GI/Hepatic negative GI ROS, Neg liver ROS,   Endo/Other  negative endocrine ROS  Renal/GU negative Renal ROS  negative genitourinary   Musculoskeletal negative musculoskeletal ROS (+)   Abdominal Normal abdominal exam  (+)   Peds negative pediatric ROS (+)  Hematology negative hematology ROS (+)   Anesthesia Other Findings Past Medical History: No date: Anxiety No date: Arthritis No date: CAD (coronary artery disease)     Comment:  a. 02/2018 s/p PCI/DES  x 2 to the prox/mid RCA. Otw nl               left cor tree. No date: Cancer [facility])     Comment:  skin cancer on forehead No date: Diastolic dysfunction     Comment:  a. 10/2018 Echo: EF 55-60%, impaired relaxation. Mod               enlarged RV. RVSP 25-18mmHg. MIldly dil RA. Mild to mod               TR.  No date: DOE (dyspnea on exertion) No date: Dyspnea 2004: H/O knee surgery     Comment:  plate and strap inserted.  to be removed for surgery               with tkr No date: Heart murmur No date: Hyperlipidemia No date: Orthostatic lightheadedness   Comment:  a. 09/2018 Zio: Avg HR 71 (60-129). 1 4 beat episode of               SVT. Rare PACs and PVCs. Triggered events correlated w/               atrial pacing. No significant arrhythmias. No date: Sinus node dysfunction (HCC)     Comment:  a. 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 No date: Sleep apnea     Comment:  mild-no cpap No date: Stroke [facility])     Comment:  mild in 2002   Reproductive/Obstetrics                             Anesthesia Physical  Anesthesia Plan  ASA: III  Anesthesia Plan: General   Post-op Pain Management:  Regional for Post-op pain   Induction: Intravenous  PONV Risk Score and Plan: 2 and Ondansetron, Dexamethasone, Midazolam and Treatment may vary due to age or medical condition  Airway Management Planned: Oral ETT  Additional Equipment:   Intra-op Plan:   Post-operative Plan: Extubation in OR  Informed Consent: I have reviewed the patients History and Physical, chart, labs and discussed the procedure including the  risks, benefits and alternatives for the proposed anesthesia with the patient or authorized representative who has indicated his/her understanding and acceptance.     Dental advisory given  Plan Discussed with: CRNA and Surgeon  Anesthesia Plan Comments:         Anesthesia Quick Evaluation

## 2018-12-05 ENCOUNTER — Other Ambulatory Visit: Payer: Self-pay

## 2018-12-05 ENCOUNTER — Other Ambulatory Visit
Admission: RE | Admit: 2018-12-05 | Discharge: 2018-12-05 | Disposition: A | Discharge status: Home / Self Care | Payer: No Typology Code available for payment source | Source: Ambulatory Visit | Attending: Cardiovascular Disease | Admitting: Cardiovascular Disease

## 2018-12-05 ENCOUNTER — Telehealth: Payer: Self-pay | Admitting: Cardiovascular Disease

## 2018-12-05 DIAGNOSIS — Z01812 Encounter for preprocedural laboratory examination: Secondary | ICD-10-CM | POA: Insufficient documentation

## 2018-12-05 DIAGNOSIS — R079 Chest pain, unspecified: Secondary | ICD-10-CM | POA: Insufficient documentation

## 2018-12-05 DIAGNOSIS — Z20828 Contact with and (suspected) exposure to other viral communicable diseases: Secondary | ICD-10-CM | POA: Insufficient documentation

## 2018-12-05 NOTE — Telephone Encounter (Signed)
Patient calling in regarding upcoming cardiac cath on 9/28. Patient wanted to make certain that Dr. Fletcher Anon was aware that patient had a rotatory cuff repair on 9/17. If this is going to impact his procedure in any way please advise patient.

## 2018-12-05 NOTE — Telephone Encounter (Signed)
Returned the patient's call. Patient sts that he just wants to make sure that his cardiac cath is on for 12/09/18. Adv the patient that it is. Adv the patient that we were aware of his 11/28/18 sx when his procedure was scheduled for 12/09/18.  Ignacia Bayley, NP 11/13/18 office note. Disposition: We will arrange for right heart catheterization later this month after he returns from a trip and then subsequently has left rotator cuff surgery.  Follow-up in clinic in about 6 weeks.  Patient verbalized understanding and voiced appreciation for the call.

## 2018-12-06 LAB — SARS CORONAVIRUS 2 (TAT 6-24 HRS): SARS Coronavirus 2: NEGATIVE

## 2018-12-09 ENCOUNTER — Encounter
Admission: RE | Disposition: A | Discharge status: Home / Self Care | Payer: Medicare HMO | Source: Home / Self Care | Attending: Cardiovascular Disease

## 2018-12-09 ENCOUNTER — Encounter: Payer: Self-pay | Admitting: *Deleted

## 2018-12-09 ENCOUNTER — Other Ambulatory Visit: Payer: Self-pay

## 2018-12-09 ENCOUNTER — Ambulatory Visit
Admission: RE | Admit: 2018-12-09 | Discharge: 2018-12-09 | Disposition: A | Discharge status: Home / Self Care | Payer: No Typology Code available for payment source | Attending: Cardiovascular Disease | Admitting: Cardiovascular Disease

## 2018-12-09 DIAGNOSIS — Z8249 Family history of ischemic heart disease and other diseases of the circulatory system: Secondary | ICD-10-CM | POA: Diagnosis not present

## 2018-12-09 DIAGNOSIS — R079 Chest pain, unspecified: Secondary | ICD-10-CM | POA: Insufficient documentation

## 2018-12-09 DIAGNOSIS — I2510 Atherosclerotic heart disease of native coronary artery without angina pectoris: Secondary | ICD-10-CM | POA: Insufficient documentation

## 2018-12-09 DIAGNOSIS — I951 Orthostatic hypotension: Secondary | ICD-10-CM | POA: Diagnosis not present

## 2018-12-09 DIAGNOSIS — I495 Sick sinus syndrome: Secondary | ICD-10-CM | POA: Diagnosis not present

## 2018-12-09 DIAGNOSIS — R0609 Other forms of dyspnea: Secondary | ICD-10-CM

## 2018-12-09 DIAGNOSIS — Z880 Allergy status to penicillin: Secondary | ICD-10-CM | POA: Insufficient documentation

## 2018-12-09 DIAGNOSIS — I119 Hypertensive heart disease without heart failure: Secondary | ICD-10-CM | POA: Insufficient documentation

## 2018-12-09 DIAGNOSIS — Z79899 Other long term (current) drug therapy: Secondary | ICD-10-CM | POA: Insufficient documentation

## 2018-12-09 DIAGNOSIS — R42 Dizziness and giddiness: Secondary | ICD-10-CM | POA: Insufficient documentation

## 2018-12-09 DIAGNOSIS — Z7902 Long term (current) use of antithrombotics/antiplatelets: Secondary | ICD-10-CM | POA: Diagnosis not present

## 2018-12-09 DIAGNOSIS — Z96651 Presence of right artificial knee joint: Secondary | ICD-10-CM | POA: Insufficient documentation

## 2018-12-09 DIAGNOSIS — Z7982 Long term (current) use of aspirin: Secondary | ICD-10-CM | POA: Insufficient documentation

## 2018-12-09 DIAGNOSIS — G4730 Sleep apnea, unspecified: Secondary | ICD-10-CM | POA: Insufficient documentation

## 2018-12-09 DIAGNOSIS — Z955 Presence of coronary angioplasty implant and graft: Secondary | ICD-10-CM | POA: Insufficient documentation

## 2018-12-09 DIAGNOSIS — E785 Hyperlipidemia, unspecified: Secondary | ICD-10-CM | POA: Insufficient documentation

## 2018-12-09 DIAGNOSIS — Z96661 Presence of right artificial ankle joint: Secondary | ICD-10-CM | POA: Insufficient documentation

## 2018-12-09 DIAGNOSIS — R0600 Dyspnea, unspecified: Secondary | ICD-10-CM | POA: Diagnosis not present

## 2018-12-09 DIAGNOSIS — Z950 Presence of cardiac pacemaker: Secondary | ICD-10-CM | POA: Insufficient documentation

## 2018-12-09 DIAGNOSIS — F419 Anxiety disorder, unspecified: Secondary | ICD-10-CM | POA: Insufficient documentation

## 2018-12-09 HISTORY — PX: RIGHT HEART CATH AND CORONARY ANGIOGRAPHY: CATH118264

## 2018-12-09 SURGERY — RIGHT HEART CATH AND CORONARY ANGIOGRAPHY
Anesthesia: Moderate Sedation | Laterality: Right

## 2018-12-09 MED ORDER — SODIUM CHLORIDE 0.9 % IV SOLN
INTRAVENOUS | Status: DC
  Administered 2018-12-09: 09:00:00 via INTRAVENOUS

## 2018-12-09 MED ORDER — SODIUM CHLORIDE 0.9% FLUSH: 3.0000 mL | Freq: Two times a day (BID) | INTRAVENOUS | Status: DC

## 2018-12-09 MED ORDER — SODIUM CHLORIDE 0.9 % IV SOLN: 250.0000 mL | INTRAVENOUS | Status: DC | PRN

## 2018-12-09 MED ORDER — FENTANYL CITRATE (PF) 100 MCG/2ML IJ SOLN
INTRAMUSCULAR | Status: DC | PRN
  Administered 2018-12-09: 25 ug via INTRAVENOUS

## 2018-12-09 MED ORDER — FENTANYL CITRATE (PF) 100 MCG/2ML IJ SOLN
INTRAMUSCULAR | Status: AC
  Filled 2018-12-09: qty 2

## 2018-12-09 MED ORDER — ONDANSETRON HCL 4 MG/2ML IJ SOLN: 4.0000 mg | Freq: Four times a day (QID) | INTRAMUSCULAR | Status: DC | PRN

## 2018-12-09 MED ORDER — MIDAZOLAM HCL 2 MG/2ML IJ SOLN
INTRAMUSCULAR | Status: DC | PRN
  Administered 2018-12-09: 1 mg via INTRAVENOUS

## 2018-12-09 MED ORDER — SODIUM CHLORIDE 0.9% FLUSH: 3.0000 mL | INTRAVENOUS | Status: DC | PRN

## 2018-12-09 MED ORDER — SODIUM CHLORIDE 0.9 % IV SOLN: INTRAVENOUS | Status: DC

## 2018-12-09 MED ORDER — ASPIRIN 81 MG PO CHEW: 81.0000 mg | CHEWABLE_TABLET | ORAL | Status: DC

## 2018-12-09 MED ORDER — HEPARIN (PORCINE) IN NACL 1000-0.9 UT/500ML-% IV SOLN
INTRAVENOUS | Status: AC
  Filled 2018-12-09: qty 1000

## 2018-12-09 MED ORDER — HEPARIN (PORCINE) IN NACL 2000-0.9 UNIT/L-% IV SOLN
INTRAVENOUS | Status: DC | PRN
  Administered 2018-12-09: 500 mL

## 2018-12-09 MED ORDER — MIDAZOLAM HCL 2 MG/2ML IJ SOLN
INTRAMUSCULAR | Status: AC
  Filled 2018-12-09: qty 2

## 2018-12-09 MED ORDER — ACETAMINOPHEN 325 MG PO TABS: 650.0000 mg | ORAL_TABLET | ORAL | Status: DC | PRN

## 2018-12-09 SURGICAL SUPPLY — 6 items
CATH BALLN WEDGE 5F 110CM (CATHETERS) ×2 IMPLANT
GUIDEWIRE EMER 3M J .025X150CM (WIRE) ×2 IMPLANT
KIT MANI 3VAL PERCEP (MISCELLANEOUS) ×2 IMPLANT
KIT RIGHT HEART (MISCELLANEOUS) IMPLANT
PACK CARDIAC CATH (CUSTOM PROCEDURE TRAY) ×2 IMPLANT
SHEATH GLIDE SLENDER 4/5FR (SHEATH) ×2 IMPLANT

## 2018-12-09 NOTE — Interval H&P Note (Signed)
History and Physical Interval Note:  12/09/2018 9:58 AM  Roger Austin  has presented today for surgery, with the diagnosis of RT Cath    Chest pain.  The various methods of treatment have been discussed with the patient and family. After consideration of risks, benefits and other options for treatment, the patient has consented to  Procedure(s): RIGHT HEART CATH AND CORONARY ANGIOGRAPHY (Right) as a surgical intervention.  The patient's history has been reviewed, patient examined, no change in status, stable for surgery.  I have reviewed the patient's chart and labs.  Questions were answered to the patient's satisfaction.     Roger Austin

## 2018-12-10 ENCOUNTER — Encounter: Payer: Self-pay | Admitting: Orthopedic Surgery

## 2018-12-17 ENCOUNTER — Other Ambulatory Visit: Payer: Self-pay

## 2018-12-17 ENCOUNTER — Encounter: Payer: Self-pay | Admitting: Internal Medicine

## 2018-12-17 ENCOUNTER — Ambulatory Visit (INDEPENDENT_AMBULATORY_CARE_PROVIDER_SITE_OTHER): Payer: No Typology Code available for payment source | Admitting: Internal Medicine

## 2018-12-17 VITALS — BP 140/76 | HR 62 | Temp 97.3°F | Ht 72.0 in | Wt 201.5 lb

## 2018-12-17 DIAGNOSIS — Z950 Presence of cardiac pacemaker: Secondary | ICD-10-CM | POA: Diagnosis not present

## 2018-12-17 DIAGNOSIS — R0600 Dyspnea, unspecified: Secondary | ICD-10-CM

## 2018-12-17 DIAGNOSIS — I495 Sick sinus syndrome: Secondary | ICD-10-CM | POA: Diagnosis not present

## 2018-12-17 NOTE — Anesthesia Postprocedure Evaluation (Signed)
Anesthesia Post Note  Patient: Roger Austin  Procedure(s) Performed: SHOULDER ARTHROSCOPY WITH MINI ROTATOR CUFF REPAIR (Left )  Patient location during evaluation: PACU Anesthesia Type: General Level of consciousness: awake and alert and oriented Pain management: pain level controlled Vital Signs Assessment: post-procedure vital signs reviewed and stable Respiratory status: spontaneous breathing Cardiovascular status: blood pressure returned to baseline Anesthetic complications: no     Last Vitals:  Vitals:   11/28/18 1733 11/28/18 1741  BP: (!) 141/84 120/81  Pulse: (!) 59 68  Resp: 14 18  Temp:  36.6 C  SpO2: 94% 100%    Last Pain:  Vitals:   11/28/18 1741  TempSrc: Temporal  PainSc: 0-No pain                 Shaasia Odle
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
## 2018-12-17 NOTE — Patient Instructions (Addendum)
Medication Instructions:  - Your physician recommends that you continue on your current medications as directed. Please refer to the Current Medication list given to you today.  If you need a refill on your cardiac medications before your next appointment, please call your pharmacy.   Lab work: - Pre-procedure COVID swab:______________________ (12:30 pm-2:30 pm) - Please drive up to the [facility] to have this done   If you have labs (blood work) drawn today and your tests are completely normal, you will receive your results only by: Marland Kitchen MyChart Message (if you have MyChart) OR . A paper copy in the mail If you have any lab test that is abnormal or we need to change your treatment, we will call you to review the results.  Testing/Procedures: - Your physician has requested that you have an exercise tolerance test (on a day Dr. [name] is in the office)  - you may eat a light breakfast/ lunch prior to your procedure - no caffeine for 24 hours prior to your test (coffee, tea, soft drinks, or chocolate)  - no smoking/ vaping for 4 hours prior to your test - you may take your regular medications the day of your test - bring any inhalers with you to your test - wear comfortable clothing & tennis/ non-skid shoes to walk on the treadmill  Follow-Up: At [facility], you and your health needs are our priority.  As part of our continuing mission to provide you with exceptional heart care, we have created designated Provider Care Teams.  These Care Teams include your primary Cardiologist (physician) and Advanced Practice Providers (APPs -  Physician Assistants and Nurse Practitioners) who all work together to provide you with the care you need, when you need it. . pending the result of your treadmill test  Any Other Special Instructions Will Be Listed Below (If Applicable). - Your physician has recommended an abdominal binder/ thigh sleeves to be worn during your waking hours

## 2018-12-27 ENCOUNTER — Ambulatory Visit: Payer: Medicare HMO | Admitting: Cardiovascular Disease

## 2019-01-02 ENCOUNTER — Encounter: Payer: Self-pay | Admitting: Cardiovascular Disease

## 2019-01-02 ENCOUNTER — Telehealth: Payer: Self-pay

## 2019-01-02 ENCOUNTER — Encounter: Payer: Self-pay | Admitting: *Deleted

## 2019-01-02 ENCOUNTER — Telehealth (INDEPENDENT_AMBULATORY_CARE_PROVIDER_SITE_OTHER): Payer: No Typology Code available for payment source | Admitting: Cardiovascular Disease

## 2019-01-02 ENCOUNTER — Telehealth: Payer: Self-pay | Admitting: Internal Medicine

## 2019-01-02 VITALS — BP 157/89 | HR 66 | Ht 72.0 in | Wt 202.0 lb

## 2019-01-02 DIAGNOSIS — I2510 Atherosclerotic heart disease of native coronary artery without angina pectoris: Secondary | ICD-10-CM | POA: Diagnosis not present

## 2019-01-02 DIAGNOSIS — Z950 Presence of cardiac pacemaker: Secondary | ICD-10-CM
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
## 2019-01-02 NOTE — Patient Instructions (Signed)
Medication Instructions:  Continue same medications *If you need a refill on your cardiac medications before your next appointment, please call your pharmacy*  Lab Work: None If you have labs (blood work) drawn today and your tests are completely normal, you will receive your results only by: Marland Kitchen MyChart Message (if you have MyChart) OR . A paper copy in the mail If you have any lab test that is abnormal or we need to change your treatment, we will call you to review the results.  Testing/Procedures: Proceed with treadmill stress test with Dr. [name] as planned  Follow-Up: At [facility], you and your health needs are our priority.  As part of our continuing mission to provide you with exceptional heart care, we have created designated Provider Care Teams.  These Care Teams include your primary Cardiologist (physician) and Advanced Practice Providers (APPs -  Physician Assistants and Nurse Practitioners) who all work together to provide you with the care you need, when you need it.  Your next appointment:   3 months  The format for your next appointment:   Either In Person or Virtual  Provider:   [name], MD  Other Instructions

## 2019-01-02 NOTE — Telephone Encounter (Signed)

## 2019-01-02 NOTE — Telephone Encounter (Signed)
Message received from Dr. Fletcher Anon today that he had an e-visit with the patient today. He advised the patient should be out of his sling soon and able to do his GXT with Dr. [name] in November.  I advised Dr. Fletcher Anon, I would need to confirm with the patient when he will be out of his sling in order to put him on the treadmill.  I called and spoke with the patient. He states his PT has told him he will be out of his arm sling on 01/10/19.  I advised the patient that we can do his GXT on 01/28/19 at 8:00 am with Dr. [name] (prior to clinic starting).  He is aware he will need to have a COVID swab the day prior at the [facility] (between 8:30 am- 10:30 pm).   I advised him of his pre-procedure instructions for his GXT. - you may eat a light breakfast/ lunch prior to your procedure - no caffeine for 24 hours prior to your test (coffee, tea, soft drinks, or chocolate)  - no smoking/ vaping for 4 hours prior to your test - you may take your regular medications the day of your test except for: DO NOT take _________________ for 24 hours prior - bring any inhalers with you to your test - wear comfortable clothing & tennis/ non-skid shoes to walk on the treadmill  Copy also mailed to the patient.  Will forward to scheduling to please place the patient on the GXT schedule on 11/17 at 8:00 am.

## 2019-01-03 NOTE — Telephone Encounter (Signed)
Scheduled

## 2019-01-27 ENCOUNTER — Other Ambulatory Visit
Admission: RE | Admit: 2019-01-27 | Discharge: 2019-01-27 | Disposition: A | Discharge status: Home / Self Care | Payer: No Typology Code available for payment source | Source: Ambulatory Visit | Attending: Internal Medicine | Admitting: Internal Medicine

## 2019-01-27 ENCOUNTER — Other Ambulatory Visit: Payer: Self-pay

## 2019-01-27 DIAGNOSIS — Z20828 Contact with and (suspected) exposure to other viral communicable diseases: Secondary | ICD-10-CM | POA: Diagnosis not present

## 2019-01-27 DIAGNOSIS — Z01812 Encounter for preprocedural laboratory examination: Secondary | ICD-10-CM | POA: Diagnosis present

## 2019-01-27 LAB — SARS CORONAVIRUS 2 (TAT 6-24 HRS): SARS Coronavirus 2: NEGATIVE

## 2019-01-28 ENCOUNTER — Encounter: Payer: Self-pay | Admitting: Internal Medicine

## 2019-01-28 ENCOUNTER — Ambulatory Visit (INDEPENDENT_AMBULATORY_CARE_PROVIDER_SITE_OTHER): Payer: Medicare HMO | Admitting: Internal Medicine

## 2019-01-28 ENCOUNTER — Ambulatory Visit (INDEPENDENT_AMBULATORY_CARE_PROVIDER_SITE_OTHER): Payer: No Typology Code available for payment source

## 2019-01-28 VITALS — Ht 72.0 in | Wt 202.0 lb

## 2019-01-28 DIAGNOSIS — R0600 Dyspnea, unspecified: Secondary | ICD-10-CM | POA: Diagnosis not present

## 2019-01-28 DIAGNOSIS — I495 Sick sinus syndrome: Secondary | ICD-10-CM

## 2019-01-28 LAB — EXERCISE TOLERANCE TEST
Estimated workload: 5.3 METS
Exercise duration (min): 3 min
Exercise duration (sec): 43 s
MPHR: 155 {beats}/min
Peak HR: 98 {beats}/min
Percent HR: 63 %
Rest HR: 75 {beats}/min

## 2019-02-12 ENCOUNTER — Telehealth: Payer: Self-pay | Admitting: Internal Medicine

## 2019-02-12 DIAGNOSIS — R0600 Dyspnea, unspecified: Secondary | ICD-10-CM

## 2019-02-12 DIAGNOSIS — R0609 Other forms of dyspnea: Secondary | ICD-10-CM

## 2019-02-12 NOTE — Telephone Encounter (Signed)
Patient calling Patient had stress test recently and said Dr [name] and Dr Fletcher Anon were to talk and then get back with him with on what to do Patient is still having symptoms and has not heard anything  Please call to discuss

## 2019-02-13 NOTE — Telephone Encounter (Signed)
To Dr. Klein to review. 

## 2019-02-13 NOTE — Telephone Encounter (Signed)
Message received from Dr. [name] by phone that he called and spoke with the patient. He advised the patient that we may need to obtain a stress echo vs a CPX test to further investigate the source of his dyspnea.  Per Dr. [name], the patient is aware we will reach back out to him once he and Dr. Fletcher Anon discuss this further.   Waiting on further MD recommendations.

## 2019-02-26 NOTE — Telephone Encounter (Signed)
Drs. Enid Baas,  Trying to follow up on this patient to see if you both have spoken and what the plan may be for him going forward.   He has a follow up appt on 04/03/19 with Dr. Fletcher Anon.   Thanks!

## 2019-02-26 NOTE — Telephone Encounter (Signed)
Dr. [name] discussed his case with me.  I reviewed his echocardiogram and I do not see any significant structural abnormalities with his valves.  I do not think a stress echo is going to be helpful. I recommend CPX test which should be scheduled.  Dr. [name] mentioned that EP will have to be involved when the test is being performed in order to program his pacemaker so he does not have chronotropic incompetence during the test. Thanks.

## 2019-02-28 NOTE — Telephone Encounter (Signed)
I spoke with the patient and advised him of Dr. Tyrell Antonio recommendations to proceed with a CPX test to further assess his symptoms of dyspnea.  I have described the test to the patient and he is agreeable with proceeding with this.   I have advised him I will place the order and he can call [phone] to schedule. He is aware a formal copy of his pre-test instructions will be coming to him in the mail.  He is also aware he will need to have a COVID swab done prior to this.   The patient voices understanding and is agreeable.  I have advised him that a [employer] rep will most likely be there at the time of his test as well to make adjustments to his device as needed.   He is aware to call back with any further questions or concerns.

## 2019-02-28 NOTE — Telephone Encounter (Signed)
I attempted to call the patient to discuss Dr. Tyrell Antonio recommendations. No answer- I left a message to please call back.

## 2019-03-05 NOTE — Telephone Encounter (Signed)
The patient is aware that his COVID swab is due on 03/28/2019- come 12:30 pm-2:30 pm to the [facility] in [place] for drive up testing.

## 2019-03-28 ENCOUNTER — Other Ambulatory Visit: Payer: Self-pay

## 2019-03-28 ENCOUNTER — Other Ambulatory Visit
Admission: RE | Admit: 2019-03-28 | Discharge: 2019-03-28 | Disposition: A | Discharge status: Home / Self Care | Payer: PRIVATE HEALTH INSURANCE | Source: Ambulatory Visit | Attending: Cardiovascular Disease | Admitting: Cardiovascular Disease

## 2019-03-28 DIAGNOSIS — Z20822 Contact with and (suspected) exposure to covid-19: Secondary | ICD-10-CM | POA: Diagnosis not present

## 2019-03-28 DIAGNOSIS — Z01812 Encounter for preprocedural laboratory examination: Secondary | ICD-10-CM | POA: Insufficient documentation

## 2019-03-28 LAB — SARS CORONAVIRUS 2 (TAT 6-24 HRS): SARS Coronavirus 2: NEGATIVE

## 2019-03-31 ENCOUNTER — Other Ambulatory Visit: Payer: Self-pay

## 2019-03-31 ENCOUNTER — Other Ambulatory Visit (HOSPITAL_COMMUNITY): Payer: Self-pay | Admitting: *Deleted

## 2019-03-31 ENCOUNTER — Ambulatory Visit (HOSPITAL_COMMUNITY): Payer: No Typology Code available for payment source | Attending: Cardiology

## 2019-03-31 ENCOUNTER — Encounter: Payer: Self-pay | Admitting: Internal Medicine

## 2019-03-31 DIAGNOSIS — R0600 Dyspnea, unspecified: Secondary | ICD-10-CM | POA: Diagnosis present

## 2019-03-31 DIAGNOSIS — R0609 Other forms of dyspnea: Secondary | ICD-10-CM

## 2019-03-31 NOTE — Progress Notes (Unsigned)
Asked to assist during CPX; had prolonged HR in the ADL rate so made the slope to exercise steeper 3>>4  Will reassess clinically Also having an unusual L chest pain, anterior axillary line without radiation but provoked with exertion and relieved with rest; I have reached out to Dr MA and will discuss in am, no ischemia noted on ECG during CPX

## 2019-04-03 ENCOUNTER — Ambulatory Visit (INDEPENDENT_AMBULATORY_CARE_PROVIDER_SITE_OTHER): Payer: No Typology Code available for payment source | Admitting: Cardiovascular Disease

## 2019-04-03 ENCOUNTER — Encounter: Payer: Self-pay | Admitting: Cardiovascular Disease

## 2019-04-03 ENCOUNTER — Other Ambulatory Visit: Payer: Self-pay

## 2019-04-03 VITALS — BP 128/68 | HR 61 | Ht 72.0 in | Wt 208.8 lb

## 2019-04-03 DIAGNOSIS — I10 Essential (primary) hypertension: Secondary | ICD-10-CM

## 2019-04-03 DIAGNOSIS — E785 Hyperlipidemia, unspecified: Secondary | ICD-10-CM | POA: Diagnosis not present

## 2019-04-03 DIAGNOSIS — I2510 Atherosclerotic heart disease of native coronary artery without angina pectoris: Secondary | ICD-10-CM

## 2019-04-03 DIAGNOSIS — Z950 Presence of cardiac pacemaker: Secondary | ICD-10-CM

## 2019-04-03 NOTE — Patient Instructions (Signed)
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
## 2019-04-22 ENCOUNTER — Other Ambulatory Visit: Payer: Self-pay | Admitting: Family Medicine

## 2019-04-22 DIAGNOSIS — R42 Dizziness and giddiness: Secondary | ICD-10-CM

## 2019-04-22 DIAGNOSIS — R519 Headache, unspecified: Secondary | ICD-10-CM

## 2019-04-23 ENCOUNTER — Ambulatory Visit: Payer: Medicare HMO

## 2019-04-29 ENCOUNTER — Other Ambulatory Visit: Payer: Self-pay

## 2019-04-29 ENCOUNTER — Ambulatory Visit
Admission: RE | Admit: 2019-04-29 | Discharge: 2019-04-29 | Disposition: A | Discharge status: Home / Self Care | Payer: PRIVATE HEALTH INSURANCE | Source: Ambulatory Visit | Attending: Family Medicine | Admitting: Family Medicine

## 2019-04-29 DIAGNOSIS — R519 Headache, unspecified: Secondary | ICD-10-CM | POA: Insufficient documentation

## 2019-04-29 DIAGNOSIS — R42 Dizziness and giddiness: Secondary | ICD-10-CM | POA: Insufficient documentation

## 2019-05-20 ENCOUNTER — Other Ambulatory Visit: Payer: Self-pay | Admitting: Internal Medicine

## 2019-05-20 DIAGNOSIS — R140 Abdominal distension (gaseous): Secondary | ICD-10-CM

## 2019-06-02 ENCOUNTER — Ambulatory Visit
Admission: RE | Admit: 2019-06-02 | Discharge: 2019-06-02 | Disposition: A | Discharge status: Home / Self Care | Payer: PRIVATE HEALTH INSURANCE | Source: Ambulatory Visit | Attending: Internal Medicine | Admitting: Internal Medicine

## 2019-06-02 ENCOUNTER — Other Ambulatory Visit: Payer: Self-pay

## 2019-06-02 DIAGNOSIS — N281 Cyst of kidney, acquired: Secondary | ICD-10-CM | POA: Insufficient documentation

## 2019-06-02 DIAGNOSIS — K5730 Diverticulosis of large intestine without perforation or abscess without bleeding: Secondary | ICD-10-CM | POA: Insufficient documentation

## 2019-06-02 DIAGNOSIS — I700 Atherosclerosis of aorta: Secondary | ICD-10-CM | POA: Insufficient documentation

## 2019-06-02 DIAGNOSIS — R140 Abdominal distension (gaseous): Secondary | ICD-10-CM | POA: Diagnosis present

## 2019-06-02 MED ORDER — IOHEXOL 300 MG/ML  SOLN
100.0000 mL | Freq: Once | INTRAMUSCULAR | Status: AC | PRN
  Administered 2019-06-02: 100 mL via INTRAVENOUS

## 2019-06-03 ENCOUNTER — Ambulatory Visit (INDEPENDENT_AMBULATORY_CARE_PROVIDER_SITE_OTHER): Payer: No Typology Code available for payment source | Admitting: *Deleted

## 2019-06-03 DIAGNOSIS — Z950 Presence of cardiac pacemaker: Secondary | ICD-10-CM

## 2019-06-04 LAB — CUP PACEART REMOTE DEVICE CHECK
Battery Impedance: 3098 Ohm
Battery Remaining Longevity: 22 mo
Battery Voltage: 2.72 V
Brady Statistic RA Percent Paced: 93 %
Date Time Interrogation Session: 20210324115622
Lead Channel Impedance Value: 0 Ohm
Lead Channel Impedance Value: 726 Ohm
Lead Channel Setting Pacing Amplitude: 2 V

## 2019-06-20 ENCOUNTER — Other Ambulatory Visit: Payer: Self-pay

## 2019-06-20 ENCOUNTER — Ambulatory Visit: Payer: PRIVATE HEALTH INSURANCE | Attending: Neurology

## 2019-06-20 DIAGNOSIS — R42 Dizziness and giddiness: Secondary | ICD-10-CM
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
## 2019-06-22 NOTE — Addendum Note (Signed)
Addended by: Roxana Hires D on: 06/22/2019 10:19 PM   Modules accepted: Orders

## 2019-06-25 ENCOUNTER — Ambulatory Visit: Payer: PRIVATE HEALTH INSURANCE

## 2019-08-27 ENCOUNTER — Ambulatory Visit: Payer: Medicare HMO | Attending: Neurology

## 2019-09-02 ENCOUNTER — Ambulatory Visit (INDEPENDENT_AMBULATORY_CARE_PROVIDER_SITE_OTHER): Payer: No Typology Code available for payment source | Admitting: *Deleted

## 2019-09-02 DIAGNOSIS — I495 Sick sinus syndrome: Secondary | ICD-10-CM | POA: Diagnosis not present

## 2019-09-03 LAB — CUP PACEART REMOTE DEVICE CHECK
Battery Impedance: 3323 Ohm
Battery Remaining Longevity: 20 mo
Battery Voltage: 2.72 V
Brady Statistic RA Percent Paced: 93 %
Date Time Interrogation Session: 20210623123610
Implantable Lead Implant Date: 19860501
Implantable Lead Location: 753859
Implantable Lead Model: 6957
Implantable Pulse Generator Implant Date: 20120420
Lead Channel Impedance Value: 0 Ohm
Lead Channel Impedance Value: 738 Ohm
Lead Channel Setting Pacing Amplitude: 2 V

## 2019-09-04 NOTE — Progress Notes (Signed)
Remote pacemaker transmission.   

## 2019-10-09 ENCOUNTER — Ambulatory Visit: Payer: Medicare HMO | Admitting: Cardiovascular Disease

## 2019-10-23 ENCOUNTER — Encounter: Payer: Self-pay | Admitting: Urology

## 2019-10-23 ENCOUNTER — Other Ambulatory Visit: Payer: Self-pay | Admitting: Pulmonary Disease

## 2019-10-23 ENCOUNTER — Ambulatory Visit (INDEPENDENT_AMBULATORY_CARE_PROVIDER_SITE_OTHER): Payer: No Typology Code available for payment source | Admitting: Urology

## 2019-10-23 ENCOUNTER — Other Ambulatory Visit: Payer: Self-pay

## 2019-10-23 VITALS — BP 113/72 | HR 66 | Temp 98.0°F | Ht 72.0 in | Wt 199.0 lb

## 2019-10-23 DIAGNOSIS — N401 Enlarged prostate with lower urinary tract symptoms: Secondary | ICD-10-CM

## 2019-10-23 DIAGNOSIS — R351 Nocturia: Secondary | ICD-10-CM

## 2019-10-23 DIAGNOSIS — R634 Abnormal weight loss: Secondary | ICD-10-CM

## 2019-10-23 LAB — BLADDER SCAN AMB NON-IMAGING: Scan Result: 48

## 2019-10-23 MED ORDER — TAMSULOSIN HCL 0.4 MG PO CAPS: 0.4000 mg | ORAL_CAPSULE | Freq: Every day | ORAL | 1 refills | Status: DC
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
## 2019-10-24 LAB — URINALYSIS, COMPLETE
Bilirubin, UA: NEGATIVE
Glucose, UA: NEGATIVE
Ketones, UA: NEGATIVE
Leukocytes,UA: NEGATIVE
Nitrite, UA: NEGATIVE
Protein,UA: NEGATIVE
Specific Gravity, UA: 1.025 (ref 1.005–1.030)
Urobilinogen, Ur: 0.2 mg/dL (ref 0.2–1.0)
pH, UA: 5 (ref 5.0–7.5)

## 2019-10-24 LAB — MICROSCOPIC EXAMINATION
Bacteria, UA: NONE SEEN
Epithelial Cells (non renal): NONE SEEN /hpf (ref 0–10)
WBC, UA: NONE SEEN /hpf (ref 0–5)

## 2019-11-03 ENCOUNTER — Ambulatory Visit
Admission: RE | Admit: 2019-11-03 | Discharge: 2019-11-03 | Disposition: A | Discharge status: Home / Self Care | Payer: PRIVATE HEALTH INSURANCE | Source: Ambulatory Visit | Attending: Pulmonary Disease | Admitting: Pulmonary Disease

## 2019-11-03 ENCOUNTER — Other Ambulatory Visit: Payer: Self-pay

## 2019-11-03 DIAGNOSIS — R42 Dizziness and giddiness: Secondary | ICD-10-CM | POA: Insufficient documentation

## 2019-11-03 DIAGNOSIS — R0602 Shortness of breath: Secondary | ICD-10-CM | POA: Diagnosis not present

## 2019-11-03 DIAGNOSIS — R634 Abnormal weight loss: Secondary | ICD-10-CM | POA: Insufficient documentation

## 2019-11-03 DIAGNOSIS — R10819 Abdominal tenderness, unspecified site: Secondary | ICD-10-CM | POA: Diagnosis not present

## 2019-11-03 LAB — POCT I-STAT CREATININE: Creatinine, Ser: 1.3 mg/dL — ABNORMAL HIGH (ref 0.61–1.24)

## 2019-11-03 MED ORDER — IOHEXOL 300 MG/ML  SOLN
100.0000 mL | Freq: Once | INTRAMUSCULAR | Status: AC | PRN
  Administered 2019-11-03: 100 mL via INTRAVENOUS

## 2019-11-15 ENCOUNTER — Other Ambulatory Visit: Payer: Self-pay | Admitting: Urology

## 2019-11-26 ENCOUNTER — Encounter: Payer: Self-pay | Admitting: Urology

## 2019-11-26 ENCOUNTER — Other Ambulatory Visit: Payer: Self-pay

## 2019-11-26 ENCOUNTER — Ambulatory Visit (INDEPENDENT_AMBULATORY_CARE_PROVIDER_SITE_OTHER): Payer: No Typology Code available for payment source | Admitting: Urology

## 2019-11-26 VITALS — BP 123/80 | HR 64 | Ht 72.0 in | Wt 200.0 lb

## 2019-11-26 DIAGNOSIS — N401 Enlarged prostate with lower urinary tract symptoms: Secondary | ICD-10-CM | POA: Diagnosis not present

## 2019-11-26 LAB — BLADDER SCAN AMB NON-IMAGING: Scan Result: 62
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
## 2019-11-27 ENCOUNTER — Encounter: Payer: Self-pay | Admitting: Urology

## 2019-11-27 MED ORDER — TAMSULOSIN HCL 0.4 MG PO CAPS: 0.4000 mg | ORAL_CAPSULE | Freq: Two times a day (BID) | ORAL | 1 refills | Status: DC

## 2019-12-02 ENCOUNTER — Ambulatory Visit (INDEPENDENT_AMBULATORY_CARE_PROVIDER_SITE_OTHER): Payer: PRIVATE HEALTH INSURANCE | Admitting: *Deleted

## 2019-12-02 DIAGNOSIS — I495 Sick sinus syndrome: Secondary | ICD-10-CM | POA: Diagnosis not present

## 2019-12-02 LAB — CUP PACEART REMOTE DEVICE CHECK
Battery Impedance: 3359 Ohm
Battery Remaining Longevity: 19 mo
Battery Voltage: 2.73 V
Brady Statistic RA Percent Paced: 93 %
Date Time Interrogation Session: 20210921125439
Implantable Lead Implant Date: 19860501
Implantable Lead Location: 753859
Implantable Lead Model: 6957
Implantable Pulse Generator Implant Date: 20120420
Lead Channel Impedance Value: 0 Ohm
Lead Channel Impedance Value: 715 Ohm
Lead Channel Setting Pacing Amplitude: 2 V

## 2019-12-03 NOTE — Progress Notes (Signed)
Remote pacemaker transmission.   

## 2019-12-10 ENCOUNTER — Other Ambulatory Visit: Payer: Self-pay | Admitting: Urology

## 2019-12-10 ENCOUNTER — Encounter (HOSPITAL_COMMUNITY): Payer: Self-pay | Admitting: Urgent Care

## 2019-12-10 ENCOUNTER — Encounter
Admission: RE | Admit: 2019-12-10 | Discharge: 2019-12-10 | Disposition: A | Discharge status: Home / Self Care | Payer: PRIVATE HEALTH INSURANCE | Source: Ambulatory Visit | Attending: Pulmonary Disease | Admitting: Pulmonary Disease

## 2019-12-10 ENCOUNTER — Encounter: Payer: Self-pay | Admitting: Internal Medicine

## 2019-12-10 ENCOUNTER — Telehealth: Payer: Self-pay | Admitting: *Deleted

## 2019-12-10 ENCOUNTER — Other Ambulatory Visit: Payer: Self-pay

## 2019-12-10 NOTE — Telephone Encounter (Signed)
   Primary Cardiologist: [name], MD  Chart revisited as part of pre-operative protocol coverage. Spoke with patient who denies any recent chest pain. Procedure is planned as outlined below for patient's dyspnea to help further evaluate cause. Given past medical history and time since last visit, based on ACC/AHA guidelines, Roger Austin would be at acceptable risk for the planned procedure without further cardiovascular testing. The patient was advised that if he develops new symptoms prior to surgery to contact our office to arrange for a follow-up visit, and he verbalized understanding.  Dr. Fletcher Anon has cleared him to hold aspirin 5 days before procedure which would mean starting today. The patient reports he's actually already been holding it since Monday 9/27. We typically advise that blood thinners be resumed when felt safe by performing physician.  I will route this recommendation to the requesting party via Epic as requested. Please call with questions.  Charlie Pitter, PA-C 12/10/2019, 5:02 PM

## 2019-12-10 NOTE — Telephone Encounter (Signed)
Hold Aspirin 5 days before procedure

## 2019-12-10 NOTE — Telephone Encounter (Signed)
° °  [facility] Pre-operative Risk Assessment    HEARTCARE STAFF: - Please ensure there is not already an duplicate clearance open for this procedure. - Under Visit Info/Reason for Call, type in Other and utilize the format Clearance MM/DD/YY or Clearance TBD. Do not use dashes or single digits. - If request is for dental extraction, please clarify the # of teeth to be extracted.  Request for surgical clearance:  1. What type of surgery is being performed? Fiberoptic bronchoscopy   2. When is this surgery scheduled? 12/15/2019    3. Are there any medications that need to be held prior to surgery? ASA    4. Practice name and name of physician performing surgery?  Performing surgeon: Dr. Ottie Glazier, MD  Requesting clearance: Honor Loh, FNP-C     5. Anesthesia type (none, local, MAC, general)? General   6. What is the office phone and fax number? [phone] (phone)  [phone] (fax)  ATTENTION: Once cleared, or if further information is required, please route message back to Honor Loh, FNP-C via [facility] [place]. No need to FAX to surgeon or PAT office. Note to be reviewed by APP in [facility]. Unable to create telephone message as per your standard workflow. Directed by [facility] providers to send requests for cardiac clearance to this pool for appropriate distribution to provider covering pre-operative clearances.      Jeanann Lewandowsky 12/10/2019, 3:19 PM  _________________________________________________________________   (provider comments below)

## 2019-12-10 NOTE — Progress Notes (Unsigned)
PERIOPERATIVE PRESCRIPTION FOR IMPLANTED CARDIAC DEVICE PROGRAMMING  Patient Information: Name:  Roger Austin  DOB:  1955/12/25  MRN:  [id number]  {TIP - You do not have to delete this tip  -  Copy the info from the staff message sent by the PAT staff  then press F2 here and paste the information using CTL - V on the next line :[id number]}  Planned Procedure: Bronchoscopy  Surgeon: Dr. Ottie Glazier, MD  Date of Procedure: 12/15/2019  Cautery will be used.   Please send documentation back to:  [facility] [phone] # [phone])    Device Information:  Clinic EP Physician:  Virl Axe, MD   Device Type:  Pacemaker Manufacturer and Phone #:  Medtronic: [phone] Pacemaker Dependent?:  Yes.   Date of Last Device Check:  12/02/19 Normal Device Function?:  Yes.    Electrophysiologist's Recommendations:   Have magnet available.  Provide continuous ECG monitoring when magnet is used or reprogramming is to be performed.   Procedure will likely interfere with device function.  Device should be programmed:  Tachy therapies disabled and Asynchronous pacing during procedure and returned to normal programming after procedure  Per Device Clinic Standing Orders, Simone Curia, RN  2:32 PM 12/10/2019

## 2019-12-10 NOTE — Telephone Encounter (Signed)
   Primary Cardiologist: [name], MD  Chart reviewed as part of pre-operative protocol coverage. Patient was contacted 12/10/2019 in reference to pre-operative risk assessment for pending surgery as outlined below.  Roger Austin was last seen on 03/2019 by Dr. Fletcher Anon with history of sinus node dysfunction s/p PPM 1985, HTN, HLD, CAD s/p rCA stent 2019, gout, OSA, psoriatic arthritis. [facility] chart also indicates history of TIA. EF 55-60% 10/2018. At last OV he was having issues with exertional dyspnea, out of proportion to recent cardiac findings/testing. He saw [facility] pulmonology. CT showed "scattered foci of predominantly ground-glass opacity within the peripheral aspects of the bilateral lung fields suggestive of an infectious or inflammatory process." Plans bronchoscopy for evaluation of PJP.  Will route to Dr. Fletcher Anon with high priority for clearance on holding ASA as requested (procedure date 12/15/19).  Charlie Pitter, PA-C 12/10/2019, 3:31 PM

## 2019-12-10 NOTE — Patient Instructions (Addendum)
Your procedure is scheduled on: 10- 4- 21 Report to Day Surgery on the 2nd floor of the [facility].  To find out your arrival time, please call [phone] between 1PM - 3PM on: 10- 1--21  REMEMBER: Instructions that are not followed completely may result in serious medical risk, up to and including death; or upon the discretion of your surgeon and anesthesiologist your surgery may need to be rescheduled.  Do not eat food after midnight the night before surgery.  No gum chewing, lozengers or hard candies.  You may however, drink CLEAR liquids up to 2 hours before you are scheduled to arrive for your surgery. Do not drink anything within 2 hours of your scheduled arrival time.  Clear liquids include: - water  - apple juice without pulp - gatorade (not RED) - black coffee or tea (Do NOT add milk or creamers to the coffee or tea) Do NOT drink anything that is not on this list.   TAKE THESE MEDICATIONS THE MORNING OF SURGERY WITH A SIP OF WATER: - Allopurinal 100 mg  - Amlodipine 2.5 mg  - gabapentin 300mg  - prednisone 10 mg  - sertraline 100 mg - flomax 0.4 mg  - protonix 40 mg ,take one the night before and one on the morning of surgery - helps to prevent nausea after surgery. Do not take Celebrex the day of surgery. Use Albuteral  inhalers on the day of surgery and bring to the hospital day of surgery.  Follow recommendations from Cardiologist, Pulmonologist or PCP regarding stopping Aspirin, Coumadin, Plavix, Eliquis, Pradaxa, or Pletal. Last dose of Aspirin was 12/08/19, stopped as instructed by MD office.  One week prior to surgery: Stop Anti-inflammatories (NSAIDS) such as Advil, Aleve, Ibuprofen, Motrin, Naproxen, Naprosyn and Aspirin based products such as Excedrin, Goodys Powder, BC Powder. Stop ANY OVER THE COUNTER supplements until after surgery. (You may continue taking Tylenol, Vitamin D, Vitamin B, and multivitamin.)  No Alcohol for 24 hours before or after  surgery.  No Smoking including e-cigarettes for 24 hours prior to surgery.  No chewable tobacco products for at least 6 hours prior to surgery.  No nicotine patches on the day of surgery.  Do not use any "recreational" drugs for at least a week prior to your surgery.  Please be advised that the combination of cocaine and anesthesia may have negative outcomes, up to and including death. If you test positive for cocaine, your surgery will be cancelled.  On the morning of surgery brush your teeth with toothpaste and water, you may rinse your mouth with mouthwash if you wish. Do not swallow any toothpaste or mouthwash.  Do not wear jewelry, make-up, hairpins, clips or nail polish.  Do not wear lotions, powders, or perfumes.   Contact lenses, hearing aids and dentures may not be worn into surgery.  Do not bring valuables to the hospital. [facility] is not responsible for any missing/lost belongings or valuables.    Notify your doctor if there is any change in your medical condition (cold, fever, infection).  Wear comfortable clothing (specific to your surgery type) to the hospital.  Plan for stool softeners for home use; pain medications have a tendency to cause constipation. You can also help prevent constipation by eating foods high in fiber such as fruits and vegetables and drinking plenty of fluids as your diet allows.  After surgery, you can help prevent lung complications by doing breathing exercises.  Take deep breaths and cough every 1-2 hours. Your  doctor may order a device called an Incentive Spirometer to help you take deep breaths. When coughing or sneezing, hold a pillow firmly against your incision with both hands. This is called "splinting." Doing this helps protect your incision. It also decreases belly discomfort.  If you are being admitted to the hospital overnight, leave your suitcase in the car. After surgery it may be brought to your room.  If you are being  discharged the day of surgery, you will not be allowed to drive home. You will need a responsible adult (18 years or older) to drive you home and stay with you that night.   If you are taking public transportation, you will need to have a responsible adult (18 years or older) with you. Please confirm with your physician that it is acceptable to use public transportation.   Please call the [place] Dept. at [phone] if you have any questions about these instructions.  Visitation Policy:  Patients undergoing a surgery or procedure may have one family member or support person with them as long as that person is not COVID-19 positive or experiencing its symptoms.  That person may remain in the waiting area during the procedure.  Inpatient Visitation Update:   In an effort to ensure the safety of our team members and our patients, we are implementing a change to our visitation policy:  Effective Monday, Aug. 9, at 7 a.m., inpatients will be allowed one support person.  o The support person may change daily.  o The support person must pass our screening, gel in and out, and wear a mask at all times, including in the patient's room.  o Patients must also wear a mask when staff or their support person are in the room.  o Masking is required regardless of vaccination status.  Systemwide, no visitors 17 or younger.

## 2019-12-11 ENCOUNTER — Ambulatory Visit: Payer: PRIVATE HEALTH INSURANCE | Admitting: Family
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
## 2019-12-11 NOTE — Progress Notes (Signed)
  [facility] Perioperative Services: Pre-Admission/Anesthesia Testing     Date: 12/11/19  Name: Roger Austin MRN:   [id number]  Re: Intraoperative cardiac device management  Spoke with device representative Maurilio Lovely) regarding EP recommendations. Per device representative, with the patient having a bronchoscopy, there should be not interference with the device. Advised representative of recommendations for tachy therapies to be disabled, asynchronous pacing throughout the procedure, and for device to be returned to normal programming after the procedure. Per device representative, no tech/rep will need to be present for this. Magnet can be placed during the procedure to achieve the aforementioned desired functioning of his implanted cardiac device. Once magnet is removed, his device will revert to its previous settings.   Spoke with Juleen China, RN in patient's EP office to confirm. I was advised that magnet placement only was appropriate and that industry representative did not need to be present for the procedure. I will update my clinical review note and make the OR charge nurse aware.   Honor Loh, MSN, APRN, FNP-C, CEN [facility]  Peri-operative Services Nurse Practitioner Phone: [phone] 12/11/19 2:54 PM

## 2019-12-12 ENCOUNTER — Other Ambulatory Visit: Payer: Self-pay

## 2019-12-12 ENCOUNTER — Other Ambulatory Visit: Payer: PRIVATE HEALTH INSURANCE

## 2019-12-12 ENCOUNTER — Encounter
Admission: RE | Admit: 2019-12-12 | Discharge: 2019-12-12 | Disposition: A | Discharge status: Home / Self Care | Payer: PRIVATE HEALTH INSURANCE | Source: Ambulatory Visit | Attending: Pulmonary Disease | Admitting: Pulmonary Disease

## 2019-12-12 DIAGNOSIS — Z01818 Encounter for other preprocedural examination: Secondary | ICD-10-CM | POA: Insufficient documentation

## 2019-12-12 DIAGNOSIS — U071 COVID-19: Secondary | ICD-10-CM | POA: Diagnosis not present

## 2019-12-12 DIAGNOSIS — I2510 Atherosclerotic heart disease of native coronary artery without angina pectoris: Secondary | ICD-10-CM | POA: Diagnosis not present

## 2019-12-12 DIAGNOSIS — Z950 Presence of cardiac pacemaker: Secondary | ICD-10-CM | POA: Diagnosis not present

## 2019-12-12 DIAGNOSIS — Z01810 Encounter for preprocedural cardiovascular examination: Secondary | ICD-10-CM | POA: Diagnosis not present

## 2019-12-12 LAB — CBC
HCT: 42.9 % (ref 39.0–52.0)
Hemoglobin: 14.2 g/dL (ref 13.0–17.0)
MCH: 31.3 pg (ref 26.0–34.0)
MCHC: 33.1 g/dL (ref 30.0–36.0)
MCV: 94.5 fL (ref 80.0–100.0)
Platelets: 229 10*3/uL (ref 150–400)
RBC: 4.54 MIL/uL (ref 4.22–5.81)
RDW: 13.6 % (ref 11.5–15.5)
WBC: 9.9 10*3/uL (ref 4.0–10.5)
nRBC: 0 % (ref 0.0–0.2)

## 2019-12-12 LAB — PROTIME-INR
INR: 0.9 (ref 0.8–1.2)
Prothrombin Time: 12.2 seconds (ref 11.4–15.2)

## 2019-12-12 LAB — APTT: aPTT: 28 seconds (ref 24–36)

## 2019-12-13 LAB — SARS CORONAVIRUS 2 (TAT 6-24 HRS): SARS Coronavirus 2: POSITIVE — AB

## 2019-12-14 ENCOUNTER — Telehealth: Payer: Self-pay | Admitting: Infectious Diseases

## 2019-12-14 NOTE — Telephone Encounter (Signed)
Called to Discuss with patient about Covid symptoms and the use of the monoclonal antibody infusion for those with mild to moderate Covid symptoms and at a high risk of hospitalization.     Pt appears to qualify for this infusion due to co-morbid conditions and/or a member of an at-risk group in accordance with the FDA Emergency Use Authorization.    Unable to reach pt   Appears he went for pre-procedure testing yesterday and COVID results +.  Unclear if he is having symptoms currently or recently. LVM to discuss further and MyChart sent.    Janene Madeira, MSN, NP-C [facility] for Infectious Disease [email or web address] Pager: [phone] Office: [phone] [place]: [phone]

## 2019-12-14 NOTE — Telephone Encounter (Signed)
Second attempt to call -   He has been - sick for 3 years now and planning a bronchoscopy to further evaluate. He has been vaccinated against [place] and has not had any worsening symptoms over the last 2 weeks and nothing he recalls over the last 3 months where he may have had COVID then.   He has our information should he develop symptoms and want the treatment with monoclonal therapy.    Janene Madeira, MSN, NP-C [facility] for Infectious Disease [email or web address] Pager: [phone] Office: [phone] [place]: [phone]

## 2019-12-15 ENCOUNTER — Encounter: Admission: RE | Payer: Self-pay | Source: Home / Self Care

## 2019-12-15 ENCOUNTER — Ambulatory Visit: Admission: RE | Admit: 2019-12-15 | Payer: Medicare HMO | Source: Home / Self Care

## 2019-12-15 SURGERY — BRONCHOSCOPY, FLEXIBLE
Anesthesia: General

## 2020-01-01 ENCOUNTER — Other Ambulatory Visit: Admission: RE | Admit: 2020-01-01 | Payer: PRIVATE HEALTH INSURANCE | Source: Ambulatory Visit

## 2020-01-04 MED ORDER — LACTATED RINGERS IV SOLN: INTRAVENOUS | Status: DC

## 2020-01-04 MED ORDER — CHLORHEXIDINE GLUCONATE 0.12 % MT SOLN
15.0000 mL | Freq: Once | OROMUCOSAL | Status: AC
  Administered 2020-01-05: 15 mL via OROMUCOSAL

## 2020-01-04 MED ORDER — ORAL CARE MOUTH RINSE: 15.0000 mL | Freq: Once | OROMUCOSAL | Status: AC

## 2020-01-05 ENCOUNTER — Encounter
Admission: RE | Disposition: A | Discharge status: Home / Self Care | Payer: Self-pay | Source: Home / Self Care | Attending: Pulmonary Disease

## 2020-01-05 ENCOUNTER — Ambulatory Visit: Payer: PRIVATE HEALTH INSURANCE | Admitting: Anesthesiology

## 2020-01-05 ENCOUNTER — Other Ambulatory Visit: Payer: Self-pay

## 2020-01-05 ENCOUNTER — Ambulatory Visit
Admission: RE | Admit: 2020-01-05 | Discharge: 2020-01-05 | Disposition: A | Discharge status: Home / Self Care | Payer: PRIVATE HEALTH INSURANCE | Attending: Pulmonary Disease | Admitting: Pulmonary Disease

## 2020-01-05 DIAGNOSIS — R0600 Dyspnea, unspecified: Secondary | ICD-10-CM | POA: Diagnosis not present

## 2020-01-05 DIAGNOSIS — L4050 Arthropathic psoriasis, unspecified: Secondary | ICD-10-CM | POA: Diagnosis not present

## 2020-01-05 DIAGNOSIS — R0789 Other chest pain: Secondary | ICD-10-CM | POA: Insufficient documentation

## 2020-01-05 DIAGNOSIS — I2510 Atherosclerotic heart disease of native coronary artery without angina pectoris: Secondary | ICD-10-CM | POA: Diagnosis not present

## 2020-01-05 DIAGNOSIS — I700 Atherosclerosis of aorta: Secondary | ICD-10-CM | POA: Insufficient documentation

## 2020-01-05 DIAGNOSIS — R5383 Other fatigue: Secondary | ICD-10-CM | POA: Diagnosis present

## 2020-01-05 DIAGNOSIS — Z85828 Personal history of other malignant neoplasm of skin: Secondary | ICD-10-CM | POA: Insufficient documentation

## 2020-01-05 DIAGNOSIS — G4730 Sleep apnea, unspecified: Secondary | ICD-10-CM | POA: Insufficient documentation

## 2020-01-05 DIAGNOSIS — D849 Immunodeficiency, unspecified: Secondary | ICD-10-CM | POA: Diagnosis not present

## 2020-01-05 DIAGNOSIS — Z880 Allergy status to penicillin: Secondary | ICD-10-CM | POA: Diagnosis not present

## 2020-01-05 DIAGNOSIS — Z8249 Family history of ischemic heart disease and other diseases of the circulatory system: Secondary | ICD-10-CM | POA: Insufficient documentation

## 2020-01-05 DIAGNOSIS — Z8616 Personal history of COVID-19: Secondary | ICD-10-CM | POA: Insufficient documentation

## 2020-01-05 DIAGNOSIS — Z8673 Personal history of transient ischemic attack (TIA), and cerebral infarction without residual deficits: Secondary | ICD-10-CM | POA: Insufficient documentation

## 2020-01-05 DIAGNOSIS — Z955 Presence of coronary angioplasty implant and graft: Secondary | ICD-10-CM | POA: Diagnosis not present

## 2020-01-05 DIAGNOSIS — I517 Cardiomegaly: Secondary | ICD-10-CM | POA: Insufficient documentation

## 2020-01-05 DIAGNOSIS — Z950 Presence of cardiac pacemaker: Secondary | ICD-10-CM | POA: Diagnosis not present

## 2020-01-05 HISTORY — PX: FIBEROPTIC BRONCHOSCOPY: SHX5367

## 2020-01-05 SURGERY — BRONCHOSCOPY, AT BEDSIDE
Anesthesia: General

## 2020-01-05 MED ORDER — EPHEDRINE SULFATE 50 MG/ML IJ SOLN
INTRAMUSCULAR | Status: DC | PRN
  Administered 2020-01-05 (×2): 10 mg via INTRAVENOUS

## 2020-01-05 MED ORDER — PROPOFOL 10 MG/ML IV BOLUS
INTRAVENOUS | Status: AC
  Filled 2020-01-05: qty 20

## 2020-01-05 MED ORDER — FENTANYL CITRATE (PF) 100 MCG/2ML IJ SOLN: 25.0000 ug | INTRAMUSCULAR | Status: DC | PRN

## 2020-01-05 MED ORDER — LIDOCAINE HCL (CARDIAC) PF 100 MG/5ML IV SOSY
PREFILLED_SYRINGE | INTRAVENOUS | Status: DC | PRN
  Administered 2020-01-05: 100 mg via INTRAVENOUS

## 2020-01-05 MED ORDER — MIDAZOLAM HCL 2 MG/2ML IJ SOLN
INTRAMUSCULAR | Status: DC | PRN
  Administered 2020-01-05: 1 mg via INTRAVENOUS

## 2020-01-05 MED ORDER — PHENYLEPHRINE HCL 0.25 % NA SOLN
1.0000 | Freq: Four times a day (QID) | NASAL | Status: DC | PRN
  Filled 2020-01-05: qty 15

## 2020-01-05 MED ORDER — MIDAZOLAM HCL 2 MG/2ML IJ SOLN
INTRAMUSCULAR | Status: AC
  Filled 2020-01-05: qty 2

## 2020-01-05 MED ORDER — PROPOFOL 10 MG/ML IV BOLUS
INTRAVENOUS | Status: DC | PRN
  Administered 2020-01-05: 100 mg via INTRAVENOUS
  Administered 2020-01-05: 30 mg via INTRAVENOUS
  Administered 2020-01-05: 150 mg via INTRAVENOUS

## 2020-01-05 MED ORDER — SUCCINYLCHOLINE CHLORIDE 20 MG/ML IJ SOLN
INTRAMUSCULAR | Status: DC | PRN
  Administered 2020-01-05: 100 mg via INTRAVENOUS

## 2020-01-05 MED ORDER — LIDOCAINE HCL (PF) 1 % IJ SOLN
30.0000 mL | Freq: Once | INTRAMUSCULAR | Status: DC
  Filled 2020-01-05: qty 30

## 2020-01-05 MED ORDER — ONDANSETRON HCL 4 MG/2ML IJ SOLN: 4.0000 mg | Freq: Once | INTRAMUSCULAR | Status: DC | PRN

## 2020-01-05 MED ORDER — CHLORHEXIDINE GLUCONATE 0.12 % MT SOLN
OROMUCOSAL | Status: AC
  Filled 2020-01-05: qty 15

## 2020-01-05 MED ORDER — BUTAMBEN-TETRACAINE-BENZOCAINE 2-2-14 % EX AERO
1.0000 | INHALATION_SPRAY | Freq: Once | CUTANEOUS | Status: DC
  Filled 2020-01-05: qty 20

## 2020-01-05 MED ORDER — FENTANYL CITRATE (PF) 100 MCG/2ML IJ SOLN
INTRAMUSCULAR | Status: DC | PRN
  Administered 2020-01-05: 25 ug via INTRAVENOUS

## 2020-01-05 MED ORDER — PROMETHAZINE HCL 25 MG/ML IJ SOLN: 6.2500 mg | INTRAMUSCULAR | Status: DC | PRN

## 2020-01-05 MED ORDER — LIDOCAINE HCL URETHRAL/MUCOSAL 2 % EX GEL
1.0000 "application " | Freq: Once | CUTANEOUS | Status: DC
  Filled 2020-01-05: qty 5

## 2020-01-05 MED ORDER — FENTANYL CITRATE (PF) 100 MCG/2ML IJ SOLN
INTRAMUSCULAR | Status: AC
Start: 2020-01-05 — End: ?
  Filled 2020-01-05: qty 2

## 2020-01-05 NOTE — Discharge Instructions (Addendum)
Flexible Bronchoscopy, Care After This sheet gives you information about how to care for yourself after your test. Your doctor may also give you more specific instructions. If you have problems or questions, contact your doctor. Follow these instructions at home: Eating and drinking  Do not eat or drink anything (not even water) for 2 hours after your test, or until your numbing medicine (local anesthetic) wears off.  When your numbness is gone and your cough and gag reflexes have come back, you may: ? Eat only soft foods. ? Slowly drink liquids.  The day after the test, go back to your normal diet. Driving  Do not drive for 24 hours if you were given a medicine to help you relax (sedative).  Do not drive or use heavy machinery while taking prescription pain medicine. General instructions   Take over-the-counter and prescription medicines only as told by your doctor.  Return to your normal activities as told. Ask what activities are safe for you.  Do not use any products that have nicotine or tobacco in them. This includes cigarettes and e-cigarettes. If you need help quitting, ask your doctor.  Keep all follow-up visits as told by your doctor. This is important. It is very important if you had a tissue sample (biopsy) taken. Get help right away if:  You have shortness of breath that gets worse.  You get light-headed.  You feel like you are going to pass out (faint).  You have chest pain.  You cough up: ? More than a little blood. ? More blood than before. Summary  Do not eat or drink anything (not even water) for 2 hours after your test, or until your numbing medicine wears off.  Do not use cigarettes. Do not use e-cigarettes.  Get help right away if you have chest pain. This information is not intended to replace advice given to you by your health care provider. Make sure you discuss any questions you have with your health care provider. Document Revised: 02/09/2017  Document Reviewed: 03/17/2016 Elsevier Patient Education  2020 [place]   1) The drugs that you were given will stay in your system until tomorrow so for the next 24 hours you should not:  A) Drive an automobile B) Make any legal decisions C) Drink any alcoholic beverage   2) You may resume regular meals tomorrow.  Today it is better to start with liquids and gradually work up to solid foods.  SOFT FOODS ONLY TODAY 01/05/20 & DRINK LIQUIDS SLOWLY.  MAY RESUME REGULAR DIET TOMORROW.   3) Please notify your doctor immediately if you have any unusual bleeding, trouble breathing, redness and pain at the surgery site, drainage, fever, or pain not relieved by medication.    4) Additional Instructions:   FOLLOW FLEXIBLE BRONCHOSCOPY DISCHARGE INSTRUCTION SHEET AS REVIEWED.        Please contact your physician with any problems or Same Day Surgery at [phone], Monday through Friday 6 am to 4 pm, or [place] at [facility] number at [phone].
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
## 2020-01-05 NOTE — Transfer of Care (Signed)
Immediate Anesthesia Transfer of Care Note  Patient: Roger Austin  Procedure(s) Performed: BEDSIdE BRONCHOSCOPY FIBEROPTIC (N/A )  Patient Location: PACU  Anesthesia Type:General  Level of Consciousness: awake, alert  and oriented  Airway & Oxygen Therapy: Patient Spontanous Breathing and Patient connected to face mask oxygen  Post-op Assessment: Report given to RN and Post -op Vital signs reviewed and stable  Post vital signs: Reviewed and stable  Last Vitals:  Vitals Value Taken Time  BP 123/64 01/05/20 1438  Temp 36.8 C 01/05/20 1438  Pulse 59 01/05/20 1450  Resp 17 01/05/20 1450  SpO2 99 % 01/05/20 1450  Vitals shown include unvalidated device data.  Last Pain:  Vitals:   01/05/20 1438  TempSrc:   PainSc: Asleep         Complications: No complications documented.

## 2020-01-05 NOTE — Anesthesia Procedure Notes (Signed)
Procedure Name: Intubation Date/Time: 01/05/2020 2:10 PM Performed by: Allean Found, CRNA Pre-anesthesia Checklist: Patient identified, Patient being monitored, Timeout performed, Emergency Drugs available and Suction available Patient Re-evaluated:Patient Re-evaluated prior to induction Oxygen Delivery Method: Circle system utilized Preoxygenation: Pre-oxygenation with 100% oxygen Induction Type: IV induction Ventilation: Mask ventilation without difficulty Laryngoscope Size: Mac and 3 Grade View: Grade I Tube type: Oral Tube size: 8.5 mm Number of attempts: 1 Airway Equipment and Method: Stylet and Video-laryngoscopy Placement Confirmation: ETT inserted through vocal cords under direct vision,  positive ETCO2 and breath sounds checked- equal and bilateral Secured at: 24 cm Tube secured with: Tape Dental Injury: Teeth and Oropharynx as per pre-operative assessment  Difficulty Due To: Difficulty was anticipated and Difficult Airway- due to limited oral opening Future Recommendations: Recommend- induction with short-acting agent, and alternative techniques readily available
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
## 2020-01-05 NOTE — Procedures (Signed)
FLEXIBLE BRONCHOSCOPY PROCEDURE NOTE    Flexible bronchoscopy was performed on 01/05/20  by : Lanney Gins   assistance by : 1)[facility] Staff  and 2)Stacey Blanch Media RT    Indication for the procedure was :  Pre-procedural H&P. The following assessment was performed on the day of the procedure prior to initiating sedation History:  Chest pain n Dyspnea y Hemoptysis n Cough y Fever n Other pertinent items n  Examination Vital signs -reviewed as per nursing documentation today Cardiac    Murmurs: n  Rubs : n  Gallop: n Lungs Wheezing: n Rales : y Rhonchi :n  Other pertinent findings: n   Pre-procedural assessment for Procedural Sedation included: Depth of sedation: As per anesthesia team  ASA Classification:  3 Mallampati airway assessment: 3    Medication list reviewed: y  The patient's interval history was taken and revealed: no new complaints The pre- procedure physical examination revealed: No new findings Refer to prior clinic note for details.  Informed Consent: Informed consent was obtained from:  patient after explanation of procedure and risks, benefits, as well as alternative procedures available.  Explanation of level of sedation and possible transfusion was also provided.    Procedural Preparation: Time out was performed and patient was identified by name and birthdate and procedure to be performed and side for sampling, if any, was specified. Pt was intubated by anesthesia.  The patient was appropriately draped.  Procedure Findings: Bronchoscope was inserted via ETT  without difficulty.  Posterior oropharynx, epiglottis, arytenoids, false cords and vocal cords were not visualized as these were bypassed by endotracheal tube.   The distal trachea was normal in circumference and appearance without mucosal, cartilaginous or branching abnormalities.  The main carina was non splayed  .   All right and left lobar airways were visualized to the Subsegmental  level.  Sub- sub segmental carinae were identified in all the distal airways.   Secretions were visible in the following airways and appeared to be phelgm.  The mucosa was : mildly friable   Airways were notable for:        exophytic lesions :none        extrinsic compression in the following distributions: left lower lobe sub-segmental atelectasis.       Friable mucosa: y       Neurosurgeon /pigmentation: n   Pictorial documentation attached: n      Specimens obtained included:   Broncho-alveolar lavage site:RML  sent for cytology                              259m volume infused 1573mvolume returned with cellular  appearance Fluoroscopy Used: zero;        Pictorial documentation attached: none                 Transbronchial WANG needle aspiration site: none sent for: n                                    Endoscopic Linear Ultrasound Used: n  Pictorial documentation attached: n   With secondary sampling in n additional lobes  Immediate sampling complications included:none  Epinephrine noneml was used topically  The bronchoscopy was terminated due to completion of the planned procedure and the bronchoscope was removed.   Total dosage of Lidocaine was none mg Total fluoroscopy time was none minutes  Estimated Blood loss: zero cc.  Complications included:  none   Disposition: home   Follow up with Dr. Lanney Gins  in 10 days for result discussion.   Claudette Stapler MD  [place] Division of Pulmonary & Critical Care Medicine

## 2020-01-06 ENCOUNTER — Encounter: Payer: Self-pay | Admitting: Pulmonary Disease

## 2020-01-06 LAB — ANA COMPREHENSIVE PANEL

## 2020-01-06 LAB — ACID FAST SMEAR (AFB, MYCOBACTERIA): Acid Fast Smear: NEGATIVE

## 2020-01-06 LAB — CYTOLOGY - NON PAP

## 2020-01-06 NOTE — Anesthesia Postprocedure Evaluation (Signed)
Anesthesia Post Note  Patient: Roger Austin  Procedure(s) Performed: BEDSIdE BRONCHOSCOPY FIBEROPTIC (N/A )  Patient location during evaluation: PACU Anesthesia Type: General Level of consciousness: awake and alert Pain management: pain level controlled Vital Signs Assessment: post-procedure vital signs reviewed and stable Respiratory status: spontaneous breathing, nonlabored ventilation and respiratory function stable Cardiovascular status: blood pressure returned to baseline and stable Postop Assessment: no apparent nausea or vomiting Anesthetic complications: no   No complications documented.   Last Vitals:  Vitals:   01/05/20 1508 01/05/20 1520  BP: 130/80 137/82  Pulse: (!) 59 63  Resp: 18 16  Temp: 36.6 C (!) 36.2 C  SpO2: 98% 98%    Last Pain:  Vitals:   01/05/20 1520  TempSrc: Temporal  PainSc: [place]

## 2020-01-07 LAB — EPSTEIN BARR VRS(EBV DNA BY PCR)
EBV DNA QN by PCR: POSITIVE copies/mL
log10 EBV DNA Qn PCR: UNDETERMINED log10 copy/mL

## 2020-01-07 LAB — CMV DNA, QUANTITATIVE, PCR
CMV DNA Quant: NEGATIVE IU/mL
Log10 CMV Qn DNA Pl: UNDETERMINED log10 IU/mL

## 2020-01-08 LAB — CULTURE, BAL-QUANTITATIVE W GRAM STAIN
Culture: 4000 — AB
Gram Stain: NONE SEEN

## 2020-01-08 LAB — ASPERGILLUS ANTIGEN, BAL/SERUM: Aspergillus Ag, BAL/Serum: 0.05 Index (ref 0.00–0.49)

## 2020-01-08 LAB — FUNGITELL, SERUM: Fungitell Result: <31 pg/mL (ref <80)

## 2020-01-12 LAB — VARICELLA-ZOSTER BY PCR: Varicella-Zoster, PCR: NEGATIVE

## 2020-01-15 ENCOUNTER — Other Ambulatory Visit: Payer: Self-pay | Admitting: Pulmonary Disease

## 2020-01-15 DIAGNOSIS — J189 Pneumonia, unspecified organism: Secondary | ICD-10-CM

## 2020-01-16 ENCOUNTER — Inpatient Hospital Stay: Admission: RE | Admit: 2020-01-16 | Payer: PRIVATE HEALTH INSURANCE | Source: Ambulatory Visit

## 2020-01-16 ENCOUNTER — Other Ambulatory Visit
Admission: RE | Admit: 2020-01-16 | Discharge: 2020-01-16 | Disposition: A | Discharge status: Home / Self Care | Payer: PRIVATE HEALTH INSURANCE | Source: Ambulatory Visit | Attending: Gastroenterology | Admitting: Gastroenterology

## 2020-01-16 ENCOUNTER — Other Ambulatory Visit: Payer: Self-pay

## 2020-01-16 DIAGNOSIS — Z20822 Contact with and (suspected) exposure to covid-19: Secondary | ICD-10-CM | POA: Insufficient documentation

## 2020-01-16 DIAGNOSIS — Z01812 Encounter for preprocedural laboratory examination: Secondary | ICD-10-CM | POA: Diagnosis not present

## 2020-01-16 LAB — SARS CORONAVIRUS 2 (TAT 6-24 HRS): SARS Coronavirus 2: NEGATIVE

## 2020-01-19 ENCOUNTER — Other Ambulatory Visit: Payer: Self-pay

## 2020-01-19 ENCOUNTER — Inpatient Hospital Stay: Admission: RE | Admit: 2020-01-19 | Payer: PRIVATE HEALTH INSURANCE | Source: Ambulatory Visit

## 2020-01-19 ENCOUNTER — Ambulatory Visit
Admission: RE | Admit: 2020-01-19 | Discharge: 2020-01-19 | Disposition: A | Discharge status: Home / Self Care | Payer: PRIVATE HEALTH INSURANCE | Source: Ambulatory Visit | Attending: Pulmonary Disease | Admitting: Pulmonary Disease

## 2020-01-19 DIAGNOSIS — J189 Pneumonia, unspecified organism: Secondary | ICD-10-CM | POA: Diagnosis not present

## 2020-01-20 ENCOUNTER — Encounter
Admission: RE | Disposition: A | Discharge status: Home / Self Care | Payer: Self-pay | Source: Home / Self Care | Attending: Gastroenterology

## 2020-01-20 ENCOUNTER — Ambulatory Visit: Payer: PRIVATE HEALTH INSURANCE | Admitting: Certified Registered Nurse Anesthetist

## 2020-01-20 ENCOUNTER — Ambulatory Visit
Admission: RE | Admit: 2020-01-20 | Discharge: 2020-01-20 | Disposition: A | Discharge status: Home / Self Care | Payer: PRIVATE HEALTH INSURANCE | Attending: Gastroenterology | Admitting: Gastroenterology

## 2020-01-20 DIAGNOSIS — Z880 Allergy status to penicillin: Secondary | ICD-10-CM | POA: Diagnosis not present

## 2020-01-20 DIAGNOSIS — Z7982 Long term (current) use of aspirin: Secondary | ICD-10-CM | POA: Diagnosis not present

## 2020-01-20 DIAGNOSIS — Z7952 Long term (current) use of systemic steroids: Secondary | ICD-10-CM | POA: Diagnosis not present

## 2020-01-20 DIAGNOSIS — Z79899 Other long term (current) drug therapy: Secondary | ICD-10-CM | POA: Insufficient documentation

## 2020-01-20 DIAGNOSIS — R933 Abnormal findings on diagnostic imaging of other parts of digestive tract: Secondary | ICD-10-CM | POA: Diagnosis present

## 2020-01-20 DIAGNOSIS — K2960 Other gastritis without bleeding: Secondary | ICD-10-CM | POA: Diagnosis not present

## 2020-01-20 DIAGNOSIS — Z1211 Encounter for screening for malignant neoplasm of colon: Secondary | ICD-10-CM | POA: Insufficient documentation

## 2020-01-20 DIAGNOSIS — Z791 Long term (current) use of non-steroidal anti-inflammatories (NSAID): Secondary | ICD-10-CM | POA: Insufficient documentation

## 2020-01-20 HISTORY — PX: COLONOSCOPY WITH PROPOFOL: SHX5780

## 2020-01-20 HISTORY — PX: ESOPHAGOGASTRODUODENOSCOPY (EGD) WITH PROPOFOL: SHX5813

## 2020-01-20 SURGERY — COLONOSCOPY WITH PROPOFOL
Anesthesia: General

## 2020-01-20 MED ORDER — FENTANYL CITRATE (PF) 100 MCG/2ML IJ SOLN
INTRAMUSCULAR | Status: AC
  Filled 2020-01-20: qty 2

## 2020-01-20 MED ORDER — EPHEDRINE SULFATE 50 MG/ML IJ SOLN
INTRAMUSCULAR | Status: DC | PRN
  Administered 2020-01-20 (×2): 10 mg via INTRAVENOUS

## 2020-01-20 MED ORDER — PROPOFOL 500 MG/50ML IV EMUL
INTRAVENOUS | Status: DC | PRN
  Administered 2020-01-20: 125 ug/kg/min via INTRAVENOUS

## 2020-01-20 MED ORDER — SODIUM CHLORIDE 0.9 % IV SOLN
INTRAVENOUS | Status: DC
  Administered 2020-01-20: 1000 mL via INTRAVENOUS

## 2020-01-20 MED ORDER — GLYCOPYRROLATE 0.2 MG/ML IJ SOLN
INTRAMUSCULAR | Status: DC | PRN
  Administered 2020-01-20: .1 mg via INTRAVENOUS

## 2020-01-20 MED ORDER — PHENYLEPHRINE HCL (PRESSORS) 10 MG/ML IV SOLN
INTRAVENOUS | Status: DC | PRN
  Administered 2020-01-20: 50 ug via INTRAVENOUS

## 2020-01-20 MED ORDER — LIDOCAINE HCL (PF) 1 % IJ SOLN
INTRAMUSCULAR | Status: AC
  Administered 2020-01-20: 0.3 mL
  Filled 2020-01-20: qty 2

## 2020-01-20 MED ORDER — LIDOCAINE HCL (CARDIAC) PF 100 MG/5ML IV SOSY
PREFILLED_SYRINGE | INTRAVENOUS | Status: DC | PRN
  Administered 2020-01-20: 100 mg via INTRAVENOUS

## 2020-01-20 NOTE — Op Note (Signed)
[facility] Gastroenterology Patient Name: Roger Austin Procedure Date: 01/20/2020 11:53 AM MRN: [id number] Account #: [id number] Date of Birth: 1955-11-15 Admit Type: Outpatient Age: 64 Room: [facility] ENDO ROOM 1 Gender: Male Note Status: Finalized Procedure:             Colonoscopy Indications:           Screening for colorectal malignant neoplasm Providers:             Andrey Farmer MD, MD Referring MD:          Mertie Clause. Fletcher Anon, MD (Referring MD) Medicines:             Monitored Anesthesia Care Complications:         No immediate complications. Procedure:             Pre-Anesthesia Assessment:                        - Prior to the procedure, a History and Physical was                         performed, and patient medications and allergies were                         reviewed. The patient is competent. The risks and                         benefits of the procedure and the sedation options and                         risks were discussed with the patient. All questions                         were answered and informed consent was obtained.                         Patient identification and proposed procedure were                         verified by the physician, the nurse, the anesthetist                         and the technician in the endoscopy suite. Mental                         Status Examination: alert and oriented. Airway                         Examination: normal oropharyngeal airway and neck                         mobility. Respiratory Examination: clear to                         auscultation. CV Examination: normal. Prophylactic                         Antibiotics: The patient does not require prophylactic  antibiotics. Prior Anticoagulants: The patient has                         taken no previous anticoagulant or antiplatelet                         agents. ASA Grade Assessment: III - A patient with                          severe systemic disease. After reviewing the risks and                         benefits, the patient was deemed in satisfactory                         condition to undergo the procedure. The anesthesia                         plan was to use monitored anesthesia care (MAC).                         Immediately prior to administration of medications,                         the patient was re-assessed for adequacy to receive                         sedatives. The heart rate, respiratory rate, oxygen                         saturations, blood pressure, adequacy of pulmonary                         ventilation, and response to care were monitored                         throughout the procedure. The physical status of the                         patient was re-assessed after the procedure.                        After obtaining informed consent, the colonoscope was                         passed under direct vision. Throughout the procedure,                         the patient's blood pressure, pulse, and oxygen                         saturations were monitored continuously. The                         Colonoscope was introduced through the anus and                         advanced to the the cecum, identified by appendiceal  orifice and ileocecal valve. The colonoscopy was                         technically difficult and complex due to poor bowel                         prep. The patient tolerated the procedure well. The                         quality of the bowel preparation was poor. Findings:      The perianal and digital rectal examinations were normal.      A large amount of semi-liquid stool was found in the entire colon,       precluding visualization. There were no obvious large lesions but       lesions could have easily been missed due to poor prep Impression:            - Preparation of the colon was poor.                        - Stool in the  entire examined colon.                        - No specimens collected. Recommendation:        - Discharge patient to home.                        - Resume previous diet.                        - Continue present medications.                        - Repeat colonoscopy at appointment to be scheduled                         because the bowel preparation was suboptimal.                        - Return to referring physician as previously                         scheduled. Procedure Code(s):     --- Professional ---                        S5681, Colorectal cancer screening; colonoscopy on                         individual not meeting criteria for high risk Diagnosis Code(s):     --- Professional ---                        Z12.11, Encounter for screening for malignant neoplasm                         of colon CPT copyright 2019 American Medical Association. All rights reserved. The codes documented in this report are preliminary and upon coder review may  be revised to meet current compliance requirements. Andrey Farmer, MD Andrey Farmer MD, MD 01/20/2020 1:31:59 PM Number of  Addenda: 0 Note Initiated On: 01/20/2020 11:53 AM Scope Withdrawal Time: 0 hours 3 minutes 59 seconds  Total Procedure Duration: 0 hours 6 minutes 53 seconds  Estimated Blood Loss:  Estimated blood loss: none.      [facility]

## 2020-01-20 NOTE — Transfer of Care (Signed)
Immediate Anesthesia Transfer of Care Note  Patient: NYSHAWN GOWDY  Procedure(s) Performed: COLONOSCOPY WITH PROPOFOL (N/A ) ESOPHAGOGASTRODUODENOSCOPY (EGD) WITH PROPOFOL (N/A )  Patient Location: PACU and Endoscopy Unit  Anesthesia Type:General  Level of Consciousness: drowsy  Airway & Oxygen Therapy: Patient Spontanous Breathing  Post-op Assessment: Report given to RN and Post -op Vital signs reviewed and stable  Post vital signs: Reviewed and stable  Last Vitals:  Vitals Value Taken Time  BP 113/75 01/20/20 1329  Temp 35.8 C 01/20/20 1329  Pulse 64 01/20/20 1332  Resp 19 01/20/20 1332  SpO2 96 % 01/20/20 1332  Vitals shown include unvalidated device data.  Last Pain:  Vitals:   01/20/20 1329  TempSrc: Temporal  PainSc: 0-No pain         Complications: No complications documented.

## 2020-01-20 NOTE — Anesthesia Postprocedure Evaluation (Signed)
Anesthesia Post Note  Patient: Roger Austin  Procedure(s) Performed: COLONOSCOPY WITH PROPOFOL (N/A ) ESOPHAGOGASTRODUODENOSCOPY (EGD) WITH PROPOFOL (N/A )  Patient location during evaluation: Endoscopy Anesthesia Type: General Level of consciousness: awake and awake and alert Pain management: pain level controlled Vital Signs Assessment: post-procedure vital signs reviewed and stable Respiratory status: spontaneous breathing and nonlabored ventilation Cardiovascular status: blood pressure returned to baseline and stable Postop Assessment: no headache and no apparent nausea or vomiting Anesthetic complications: no   No complications documented.   Last Vitals:  Vitals:   01/20/20 1233  BP: 101/71  Pulse: 61  Resp: 16  Temp: 36.5 C  SpO2: 99%    Last Pain:  Vitals:   01/20/20 1233  TempSrc: Temporal  PainSc: 0-No pain                 Neva Seat

## 2020-01-20 NOTE — Op Note (Signed)
[facility] Gastroenterology Patient Name: Roger Austin Procedure Date: 01/20/2020 11:58 AM MRN: [id number] Account #: [id number] Date of Birth: 02/19/1956 Admit Type: Outpatient Age: 64 Room: [facility] ENDO ROOM 1 Gender: Male Note Status: Finalized Procedure:             Upper GI endoscopy Indications:           Abnormal CT of the GI tract, Early satiety Providers:             Andrey Farmer MD, MD Referring MD:          Mertie Clause. Fletcher Anon, MD (Referring MD) Medicines:             Monitored Anesthesia Care Complications:         No immediate complications. Estimated blood loss:                         Minimal. Procedure:             Pre-Anesthesia Assessment:                        - Prior to the procedure, a History and Physical was                         performed, and patient medications and allergies were                         reviewed. The patient is competent. The risks and                         benefits of the procedure and the sedation options and                         risks were discussed with the patient. All questions                         were answered and informed consent was obtained.                         Patient identification and proposed procedure were                         verified by the physician, the nurse, the anesthetist                         and the technician in the endoscopy suite. Mental                         Status Examination: alert and oriented. Airway                         Examination: normal oropharyngeal airway and neck                         mobility. Respiratory Examination: clear to                         auscultation. CV Examination: normal. Prophylactic  Antibiotics: The patient does not require prophylactic                         antibiotics. Prior Anticoagulants: The patient has                         taken no previous anticoagulant or antiplatelet                          agents. ASA Grade Assessment: III - A patient with                         severe systemic disease. After reviewing the risks and                         benefits, the patient was deemed in satisfactory                         condition to undergo the procedure. The anesthesia                         plan was to use monitored anesthesia care (MAC).                         Immediately prior to administration of medications,                         the patient was re-assessed for adequacy to receive                         sedatives. The heart rate, respiratory rate, oxygen                         saturations, blood pressure, adequacy of pulmonary                         ventilation, and response to care were monitored                         throughout the procedure. The physical status of the                         patient was re-assessed after the procedure.                        After obtaining informed consent, the endoscope was                         passed under direct vision. Throughout the procedure,                         the patient's blood pressure, pulse, and oxygen                         saturations were monitored continuously. The Endoscope                         was introduced through the mouth, and advanced to the  second part of duodenum. The upper GI endoscopy was                         accomplished without difficulty. The patient tolerated                         the procedure well. Findings:      The examined esophagus was normal. Biopsies were taken with a cold       forceps for histology. Estimated blood loss was minimal.      Diffuse mildly erythematous mucosa without bleeding was found in the       gastric antrum. Biopsies were taken with a cold forceps for Helicobacter       pylori testing. Estimated blood loss was minimal.      The examined duodenum was normal. Impression:            - Normal esophagus. Biopsied.                         - Erythematous mucosa in the antrum. Biopsied.                        - Normal examined duodenum. Recommendation:        - Discharge patient to home.                        - Resume previous diet.                        - Continue present medications.                        - Await pathology results.                        - Return to referring physician as previously                         scheduled. Procedure Code(s):     --- Professional ---                        [phone], Esophagogastroduodenoscopy, flexible,                         transoral; with biopsy, single or multiple Diagnosis Code(s):     --- Professional ---                        K31.89, Other diseases of stomach and duodenum                        R68.81, Early satiety                        R93.3, Abnormal findings on diagnostic imaging of                         other parts of digestive tract CPT copyright 2019 American Medical Association. All rights reserved. The codes documented in this report are preliminary and upon coder review may  be revised to meet current compliance requirements. Andrey Farmer, MD Andrey Farmer MD,  MD 01/20/2020 1:27:44 PM Number of Addenda: 0 Note Initiated On: 01/20/2020 11:58 AM Estimated Blood Loss:  Estimated blood loss was minimal.      [facility]

## 2020-01-20 NOTE — H&P (Signed)
Outpatient short stay form Pre-procedure 01/20/2020 12:55 PM Raylene Miyamoto MD, MPH  Primary Physician: Dr. Ginette Pitman  Reason for visit:  Abnormal imaging of esophagus and screening colon  History of present illness:   64 y/o gentleman with recent history of unexplained fatigue and shortness of breath here for EGD/Colonoscopy for imaging showing possible esophagitis and early satiety. Colon for colon cancer screening with last colonoscopy being in 2002. No family history of GI malignancies.    Current Facility-Administered Medications:  .  lidocaine (PF) (XYLOCAINE) 1 % injection, , , ,   Medications Prior to Admission  Medication Sig Dispense Refill Last Dose  . albuterol (VENTOLIN HFA) 108 (90 Base) MCG/ACT inhaler Inhale 1-2 puffs into the lungs every 6 (six) hours as needed for wheezing or shortness of breath.   01/19/2020 at Unknown time  . amLODipine (NORVASC) 2.5 MG tablet Take 2.5 mg by mouth daily.   01/20/2020 at 0500  . Adalimumab (HUMIRA PEN) 40 MG/0.4ML PNKT Inject 40 mg into the skin every 14 (fourteen) days.      Marland Kitchen allopurinol (ZYLOPRIM) 100 MG tablet Take 200 mg by mouth daily with lunch.      Marland Kitchen aspirin EC 81 MG tablet Take 81 mg by mouth daily.   01/14/2020  . celecoxib (CELEBREX) 200 MG capsule Take 200 mg by mouth daily.      Marland Kitchen gabapentin (NEURONTIN) 300 MG capsule Take 300 mg by mouth 4 (four) times daily -  before meals and at bedtime.      . pantoprazole (PROTONIX) 40 MG tablet Take 40 mg by mouth 2 (two) times daily.     . predniSONE (DELTASONE) 10 MG tablet Take 10 mg by mouth daily with breakfast.     . rosuvastatin (CRESTOR) 10 MG tablet Take 10 mg by mouth at bedtime.      . sertraline (ZOLOFT) 100 MG tablet Take 100 mg by mouth daily.     . sertraline (ZOLOFT) 50 MG tablet Take 100 mg by mouth daily. (Patient not taking: Reported on 12/10/2019)     . tamsulosin (FLOMAX) 0.4 MG CAPS capsule Take 1 capsule (0.4 mg total) by mouth 2 (two) times daily. 180 capsule 1   .  topiramate (TOPAMAX) 25 MG tablet Take 25 mg by mouth at bedtime.        Allergies  Allergen Reactions  . Penicillins Swelling    DID THE REACTION INVOLVE: Swelling of the face/tongue/throat, SOB, or low BP? Yes Sudden or severe rash/hives, skin peeling, or the inside of the mouth or nose? No Did it require medical treatment? No When did it last happen?40 years ago If all above answers are "NO", may proceed with cephalosporin use.      Past Medical History:  Diagnosis Date  . Anxiety   . Arthritis   . CAD (coronary artery disease)    a. 02/2018 s/p PCI/DES  x 2 to the prox/mid RCA. Otw nl left cor tree.  . Cancer ([place])    skin cancer on forehead  . Diastolic dysfunction    a. 10/2018 Echo: EF 55-60%, impaired relaxation. Mod enlarged RV. RVSP 25-58mmHg. MIldly dil RA. Mild to mod TR.   . DOE (dyspnea on exertion)   . Dyspnea   . H/O knee surgery 2004   plate and strap inserted.  to be removed for surgery with tkr  . Heart murmur   . Hyperlipidemia   . Orthostatic lightheadedness    a. 09/2018 Zio: Avg HR 71 (60-129). 1  4 beat episode of SVT. Rare PACs and PVCs. Triggered events correlated w/ atrial pacing. No significant arrhythmias.  . 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 Sinus node dysfunction (HCC)    a. 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  . Sleep apnea    mild-no cpap  . Stroke [facility])    mild in 2002    Review of systems:  Otherwise negative.    Physical Exam  Gen: Alert, oriented. Appears stated age.  HEENT: PERRLA. Lungs: No respiratory distress CV: RRR Abd: soft, benign, no masses. Ext: No edema.     Planned procedures: Proceed with EGD/colonoscopy. The patient understands the nature of the planned procedure, indications, risks, alternatives and potential complications including but not limited to bleeding, infection, perforation, damage to internal organs and possible oversedation/side effects  from anesthesia. The patient agrees and gives consent to proceed.  Please refer to procedure notes for findings, recommendations and patient disposition/instructions.     Raylene Miyamoto MD, MPH Gastroenterology 01/20/2020  12:55 PM

## 2020-01-20 NOTE — Interval H&P Note (Signed)
History and Physical Interval Note:  01/20/2020 12:58 PM  Roger Austin  has presented today for surgery, with the diagnosis of SCREENING EARLY SATIETY GERD w/ESOPHAGITIS.  The various methods of treatment have been discussed with the patient and family. After consideration of risks, benefits and other options for treatment, the patient has consented to  Procedure(s): COLONOSCOPY WITH PROPOFOL (N/A) ESOPHAGOGASTRODUODENOSCOPY (EGD) WITH PROPOFOL (N/A) as a surgical intervention.  The patient's history has been reviewed, patient examined, no change in status, stable for surgery.  I have reviewed the patient's chart and labs.  Questions were answered to the patient's satisfaction.     Lesly Rubenstein  Ok to proceed with EGD/Colonoscopy

## 2020-01-20 NOTE — Anesthesia Procedure Notes (Signed)
Procedure Name: MAC Date/Time: 01/20/2020 1:23 PM Performed by: Lily Peer, Epifanio Labrador, CRNA Pre-anesthesia Checklist: Patient identified, Emergency Drugs available, Suction available and Patient being monitored Patient Re-evaluated:Patient Re-evaluated prior to induction Oxygen Delivery Method: Simple face mask

## 2020-01-20 NOTE — Anesthesia Preprocedure Evaluation (Signed)
Anesthesia Evaluation  Patient identified by MRN, date of birth, ID band Patient awake    Reviewed: Allergy & Precautions, NPO status , Patient's Chart, lab work & pertinent test results  Airway Mallampati: II       Dental no notable dental hx.    Pulmonary sleep apnea ,    Pulmonary exam normal breath sounds clear to auscultation       Cardiovascular hypertension, + angina + CAD  Normal cardiovascular exam+ pacemaker + Valvular Problems/Murmurs  Rhythm:Regular Rate:Normal + Diastolic murmurs    Neuro/Psych CVA negative psych ROS   GI/Hepatic negative GI ROS, Neg liver ROS,   Endo/Other  negative endocrine ROS  Renal/GU negative Renal ROS  negative genitourinary   Musculoskeletal  (+) Arthritis , Osteoarthritis,    Abdominal   Peds negative pediatric ROS (+)  Hematology negative hematology ROS (+)   Anesthesia Other Findings   Reproductive/Obstetrics negative OB ROS                             Anesthesia Physical Anesthesia Plan  ASA: III  Anesthesia Plan: General   Post-op Pain Management:    Induction: Intravenous  PONV Risk Score and Plan: 2  Airway Management Planned: Nasal Cannula  Additional Equipment: None  Intra-op Plan:   Post-operative Plan:   Informed Consent: I have reviewed the patients History and Physical, chart, labs and discussed the procedure including the risks, benefits and alternatives for the proposed anesthesia with the patient or authorized representative who has indicated his/her understanding and acceptance.       Plan Discussed with: CRNA, Anesthesiologist and Surgeon  Anesthesia Plan Comments:         Anesthesia Quick Evaluation

## 2020-01-21 ENCOUNTER — Encounter: Payer: Self-pay | Admitting: Gastroenterology

## 2020-01-21 LAB — SURGICAL PATHOLOGY

## 2020-02-17 ENCOUNTER — Other Ambulatory Visit: Payer: Self-pay | Admitting: Pulmonary Disease

## 2020-02-17 ENCOUNTER — Ambulatory Visit
Admission: RE | Admit: 2020-02-17 | Discharge: 2020-02-17 | Disposition: A | Discharge status: Home / Self Care | Payer: No Typology Code available for payment source | Source: Ambulatory Visit | Attending: Pulmonary Disease | Admitting: Pulmonary Disease

## 2020-02-17 ENCOUNTER — Other Ambulatory Visit: Payer: Self-pay

## 2020-02-17 DIAGNOSIS — R0600 Dyspnea, unspecified: Secondary | ICD-10-CM

## 2020-02-17 DIAGNOSIS — R0609 Other forms of dyspnea: Secondary | ICD-10-CM

## 2020-02-17 LAB — POCT I-STAT CREATININE: Creatinine, Ser: 1.4 mg/dL — ABNORMAL HIGH (ref 0.61–1.24)

## 2020-02-17 MED ORDER — IOHEXOL 350 MG/ML SOLN
100.0000 mL | Freq: Once | INTRAVENOUS | Status: AC | PRN
  Administered 2020-02-17: 100 mL via INTRAVENOUS

## 2020-02-18 LAB — ACID FAST CULTURE WITH REFLEXED SENSITIVITIES (MYCOBACTERIA): Acid Fast Culture: NEGATIVE

## 2020-02-26 ENCOUNTER — Ambulatory Visit: Payer: Self-pay | Admitting: Urology

## 2020-03-02 ENCOUNTER — Ambulatory Visit (INDEPENDENT_AMBULATORY_CARE_PROVIDER_SITE_OTHER): Payer: PRIVATE HEALTH INSURANCE

## 2020-03-02 DIAGNOSIS — I495 Sick sinus syndrome: Secondary | ICD-10-CM | POA: Diagnosis not present

## 2020-03-04 ENCOUNTER — Other Ambulatory Visit: Payer: PRIVATE HEALTH INSURANCE | Attending: Gastroenterology

## 2020-03-05 LAB — CUP PACEART REMOTE DEVICE CHECK
Battery Impedance: 3387 Ohm
Battery Remaining Longevity: 19 mo
Battery Voltage: 2.72 V
Brady Statistic RA Percent Paced: 92 %
Date Time Interrogation Session: 20211222111539
Implantable Lead Implant Date: 19860501
Implantable Lead Location: 753859
Implantable Lead Model: 6957
Implantable Pulse Generator Implant Date: 20120420
Lead Channel Impedance Value: 0 Ohm
Lead Channel Impedance Value: 697 Ohm
Lead Channel Setting Pacing Amplitude: 2 V

## 2020-03-08 ENCOUNTER — Ambulatory Visit
Admission: RE | Admit: 2020-03-08 | Discharge: 2020-03-08 | Disposition: A | Discharge status: Home / Self Care | Payer: PRIVATE HEALTH INSURANCE | Attending: Gastroenterology | Admitting: Gastroenterology

## 2020-03-08 ENCOUNTER — Encounter
Admission: RE | Disposition: A | Discharge status: Home / Self Care | Payer: Self-pay | Source: Home / Self Care | Attending: Gastroenterology

## 2020-03-08 ENCOUNTER — Ambulatory Visit: Payer: PRIVATE HEALTH INSURANCE | Admitting: Certified Registered Nurse Anesthetist

## 2020-03-08 DIAGNOSIS — Z7982 Long term (current) use of aspirin: Secondary | ICD-10-CM | POA: Insufficient documentation

## 2020-03-08 DIAGNOSIS — Z1211 Encounter for screening for malignant neoplasm of colon: Secondary | ICD-10-CM | POA: Diagnosis present

## 2020-03-08 DIAGNOSIS — Z8616 Personal history of COVID-19: Secondary | ICD-10-CM | POA: Diagnosis not present

## 2020-03-08 DIAGNOSIS — Z79899 Other long term (current) drug therapy: Secondary | ICD-10-CM | POA: Insufficient documentation

## 2020-03-08 DIAGNOSIS — D124 Benign neoplasm of descending colon: Secondary | ICD-10-CM | POA: Diagnosis not present

## 2020-03-08 DIAGNOSIS — Z7952 Long term (current) use of systemic steroids: Secondary | ICD-10-CM | POA: Diagnosis not present

## 2020-03-08 DIAGNOSIS — K640 First degree hemorrhoids: Secondary | ICD-10-CM | POA: Diagnosis not present

## 2020-03-08 DIAGNOSIS — Z880 Allergy status to penicillin: Secondary | ICD-10-CM | POA: Diagnosis not present

## 2020-03-08 DIAGNOSIS — Z791 Long term (current) use of non-steroidal anti-inflammatories (NSAID): Secondary | ICD-10-CM | POA: Diagnosis not present

## 2020-03-08 DIAGNOSIS — K5730 Diverticulosis of large intestine without perforation or abscess without bleeding: Secondary | ICD-10-CM | POA: Diagnosis not present

## 2020-03-08 HISTORY — PX: COLONOSCOPY: SHX5424

## 2020-03-08 SURGERY — COLONOSCOPY
Anesthesia: General

## 2020-03-08 MED ORDER — SODIUM CHLORIDE 0.9 % IV SOLN
INTRAVENOUS | Status: DC
  Administered 2020-03-08: 20 mL/h via INTRAVENOUS

## 2020-03-08 MED ORDER — PROPOFOL 10 MG/ML IV BOLUS
INTRAVENOUS | Status: DC | PRN
  Administered 2020-03-08: 70 mg via INTRAVENOUS

## 2020-03-08 MED ORDER — PROPOFOL 500 MG/50ML IV EMUL
INTRAVENOUS | Status: DC | PRN
  Administered 2020-03-08: 200 ug/kg/min via INTRAVENOUS

## 2020-03-08 MED ORDER — SODIUM CHLORIDE 0.9 % IV SOLN: INTRAVENOUS | Status: DC | PRN

## 2020-03-08 MED ORDER — PROPOFOL 500 MG/50ML IV EMUL
INTRAVENOUS | Status: AC
  Filled 2020-03-08: qty 50

## 2020-03-08 MED ORDER — LIDOCAINE HCL (CARDIAC) PF 100 MG/5ML IV SOSY
PREFILLED_SYRINGE | INTRAVENOUS | Status: DC | PRN
  Administered 2020-03-08: 50 mg via INTRAVENOUS

## 2020-03-08 NOTE — Interval H&P Note (Signed)
History and Physical Interval Note:  03/08/2020 9:33 AM  Roger Austin  has presented today for surgery, with the diagnosis of COLON CANCER SCREENING.  The various methods of treatment have been discussed with the patient and family. After consideration of risks, benefits and other options for treatment, the patient has consented to  Procedure(s) with comments: COLONOSCOPY (N/A) - **COVID POSITIVE 12/14/2019 as a surgical intervention.  The patient's history has been reviewed, patient examined, no change in status, stable for surgery.  I have reviewed the patient's chart and labs.  Questions were answered to the patient's satisfaction.     Lesly Rubenstein  Ok to proceed with colonoscopy

## 2020-03-08 NOTE — Op Note (Signed)
[facility] Gastroenterology Patient Name: Roger Austin Procedure Date: 03/08/2020 9:33 AM MRN: [id number] Account #: [id number] Date of Birth: 11-04-1955 Admit Type: Outpatient Age: 64 Room: [facility] ENDO ROOM 3 Gender: Male Note Status: Finalized Procedure:             Colonoscopy Indications:           Screening for colorectal malignant neoplasm Providers:             Andrey Farmer MD, MD Referring MD:          Tracie Harrier, MD (Referring MD) Medicines:             Monitored Anesthesia Care Complications:         No immediate complications. Estimated blood loss:                         Minimal. Procedure:             Pre-Anesthesia Assessment:                        - Prior to the procedure, a History and Physical was                         performed, and patient medications and allergies were                         reviewed. The patient is competent. The risks and                         benefits of the procedure and the sedation options and                         risks were discussed with the patient. All questions                         were answered and informed consent was obtained.                         Patient identification and proposed procedure were                         verified by the physician, the nurse, the anesthetist                         and the technician in the endoscopy suite. Mental                         Status Examination: alert and oriented. Airway                         Examination: normal oropharyngeal airway and neck                         mobility. Respiratory Examination: clear to                         auscultation. CV Examination: normal. Prophylactic  Antibiotics: The patient does not require prophylactic                         antibiotics. Prior Anticoagulants: The patient has                         taken no previous anticoagulant or antiplatelet                         agents. ASA  Grade Assessment: III - A patient with                         severe systemic disease. After reviewing the risks and                         benefits, the patient was deemed in satisfactory                         condition to undergo the procedure. The anesthesia                         plan was to use monitored anesthesia care (MAC).                         Immediately prior to administration of medications,                         the patient was re-assessed for adequacy to receive                         sedatives. The heart rate, respiratory rate, oxygen                         saturations, blood pressure, adequacy of pulmonary                         ventilation, and response to care were monitored                         throughout the procedure. The physical status of the                         patient was re-assessed after the procedure.                        After obtaining informed consent, the colonoscope was                         passed under direct vision. Throughout the procedure,                         the patient's blood pressure, pulse, and oxygen                         saturations were monitored continuously. The                         Colonoscope was introduced through the anus and  advanced to the the cecum, identified by appendiceal                         orifice and ileocecal valve. The colonoscopy was                         performed without difficulty. The patient tolerated                         the procedure well. The quality of the bowel                         preparation was adequate to identify polyps. Findings:      The perianal and digital rectal examinations were normal.      A 2 mm polyp was found in the ascending colon. The polyp was sessile.       The polyp was removed with a jumbo cold forceps. Resection and retrieval       were complete. Estimated blood loss was minimal.      Two sessile polyps were found in the  descending colon. The polyps were 1       to 2 mm in size. These polyps were removed with a jumbo cold forceps.       Resection and retrieval were complete. Estimated blood loss was minimal.      A few small-mouthed diverticula were found in the sigmoid colon.      Non-bleeding internal hemorrhoids were found during retroflexion. The       hemorrhoids were Grade I (internal hemorrhoids that do not prolapse).      The exam was otherwise without abnormality on direct and retroflexion       views. Impression:            - One 2 mm polyp in the ascending colon, removed with                         a jumbo cold forceps. Resected and retrieved.                        - Two 1 to 2 mm polyps in the descending colon,                         removed with a jumbo cold forceps. Resected and                         retrieved.                        - Diverticulosis in the sigmoid colon.                        - Non-bleeding internal hemorrhoids.                        - The examination was otherwise normal on direct and                         retroflexion views. Recommendation:        - Discharge patient to home.                        -  Resume previous diet.                        - Continue present medications.                        - Await pathology results.                        - Repeat colonoscopy for surveillance based on                         pathology results.                        - Return to referring physician as previously                         scheduled. Procedure Code(s):     --- Professional ---                        [phone], Colonoscopy, flexible; with biopsy, single or                         multiple Diagnosis Code(s):     --- Professional ---                        K63.5, Polyp of colon                        Z12.11, Encounter for screening for malignant neoplasm                         of colon                        K64.0, First degree hemorrhoids                         K57.30, Diverticulosis of large intestine without                         perforation or abscess without bleeding CPT copyright 2019 [employer]. All rights reserved. The codes documented in this report are preliminary and upon coder review may  be revised to meet current compliance requirements. Andrey Farmer, MD Andrey Farmer MD, MD 03/08/2020 10:05:40 AM Number of Addenda: 0 Note Initiated On: 03/08/2020 9:33 AM Scope Withdrawal Time: 0 hours 14 minutes 32 seconds  Total Procedure Duration: 0 hours 18 minutes 36 seconds  Estimated Blood Loss:  Estimated blood loss was minimal.      [facility]

## 2020-03-08 NOTE — H&P (Signed)
Outpatient short stay form Pre-procedure 03/08/2020 9:31 AM Roger Miyamoto MD, MPH  Primary Physician: Dr. Ginette Pitman  Reason for visit:  Screening  History of present illness:   64 y/o gentleman with non-specific symptoms of fatigue and weight loss here for colonoscopy. Had one with poor prep a little over one month ago. No family history of GI malignancies. No blood thinners. No abdominal surgeries.    Current Facility-Administered Medications:  .  0.9 %  sodium chloride infusion, , Intravenous, Continuous, Dondra Rhett, Hilton Cork, MD, Last Rate: 20 mL/hr at 03/08/20 0829, 20 mL/hr at 03/08/20 0829  Medications Prior to Admission  Medication Sig Dispense Refill Last Dose  . Adalimumab (HUMIRA PEN) 40 MG/0.4ML PNKT Inject 40 mg into the skin every 14 (fourteen) days.    03/07/2020 at Unknown time  . albuterol (VENTOLIN HFA) 108 (90 Base) MCG/ACT inhaler Inhale 1-2 puffs into the lungs every 6 (six) hours as needed for wheezing or shortness of breath.   03/07/2020 at Unknown time  . allopurinol (ZYLOPRIM) 100 MG tablet Take 200 mg by mouth daily with lunch.    03/07/2020 at Unknown time  . amLODipine (NORVASC) 2.5 MG tablet Take 2.5 mg by mouth daily.   03/07/2020 at Unknown time  . aspirin EC 81 MG tablet Take 81 mg by mouth daily.   Past Week at Unknown time  . celecoxib (CELEBREX) 200 MG capsule Take 200 mg by mouth daily.    03/08/2020 at 0430  . gabapentin (NEURONTIN) 300 MG capsule Take 300 mg by mouth 4 (four) times daily -  before meals and at bedtime.    03/08/2020 at 0430  . pantoprazole (PROTONIX) 40 MG tablet Take 40 mg by mouth 2 (two) times daily.   03/07/2020 at Unknown time  . predniSONE (DELTASONE) 10 MG tablet Take 10 mg by mouth daily with breakfast.   03/07/2020 at Unknown time  . rosuvastatin (CRESTOR) 10 MG tablet Take 10 mg by mouth at bedtime.    03/07/2020 at 0430  . sertraline (ZOLOFT) 100 MG tablet Take 100 mg by mouth daily.   03/08/2020 at 0430  . sertraline (ZOLOFT)  50 MG tablet Take 100 mg by mouth daily.   Past Month at Unknown time  . tamsulosin (FLOMAX) 0.4 MG CAPS capsule Take 1 capsule (0.4 mg total) by mouth 2 (two) times daily. 180 capsule 1 03/07/2020 at Unknown time  . topiramate (TOPAMAX) 25 MG tablet Take 25 mg by mouth at bedtime.   03/07/2020 at Unknown time     Allergies  Allergen Reactions  . Penicillins Swelling    DID THE REACTION INVOLVE: Swelling of the face/tongue/throat, SOB, or low BP? Yes Sudden or severe rash/hives, skin peeling, or the inside of the mouth or nose? No Did it require medical treatment? No When did it last happen?40 years ago If all above answers are "NO", may proceed with cephalosporin use.      Past Medical History:  Diagnosis Date  . Anxiety   . Arthritis   . CAD (coronary artery disease)    a. 02/2018 s/p PCI/DES  x 2 to the prox/mid RCA. Otw nl left cor tree.  . Cancer ([place])    skin cancer on forehead  . Diastolic dysfunction    a. 10/2018 Echo: EF 55-60%, impaired relaxation. Mod enlarged RV. RVSP 25-36mmHg. MIldly dil RA. Mild to mod TR.   . DOE (dyspnea on exertion)   . Dyspnea   . H/O knee surgery 2004   plate and  strap inserted.  to be removed for surgery with tkr  . Heart murmur   . Hyperlipidemia   . Orthostatic lightheadedness    a. 09/2018 Zio: Avg HR 71 (60-129). 1 4 beat episode of SVT. Rare PACs and PVCs. Triggered events correlated w/ atrial pacing. No significant arrhythmias.  . 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 Sinus node dysfunction (HCC)    a. 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  . Sleep apnea    mild-no cpap  . Stroke [facility])    mild in 2002    Review of systems:  Otherwise negative.    Physical Exam  Gen: Alert, oriented. Appears stated age.  HEENT: PERRLA. Lungs: No respiratory distress CV: RRR Abd: soft, benign, no masses Ext: No edema    Planned procedures: Proceed with colonoscopy. The patient  understands the nature of the planned procedure, indications, risks, alternatives and potential complications including but not limited to bleeding, infection, perforation, damage to internal organs and possible oversedation/side effects from anesthesia. The patient agrees and gives consent to proceed.  Please refer to procedure notes for findings, recommendations and patient disposition/instructions.     Roger Miyamoto MD, MPH. Gastroenterology 03/08/2020  9:31 AM
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
## 2020-03-08 NOTE — Transfer of Care (Signed)
Immediate Anesthesia Transfer of Care Note  Patient: Roger Austin  Procedure(s) Performed: COLONOSCOPY (N/A )  Patient Location: PACU and Endoscopy Unit  Anesthesia Type:General  Level of Consciousness: drowsy  Airway & Oxygen Therapy: Patient Spontanous Breathing  Post-op Assessment: Report given to RN  Post vital signs: Reviewed and stable  Last Vitals:  Vitals Value Taken Time  BP 98/68 03/08/20 1004  Temp    Pulse 60 03/08/20 1004  Resp 13 03/08/20 1004  SpO2 99 % 03/08/20 1004  Vitals shown include unvalidated device data.  Last Pain:  Vitals:   03/08/20 0816  TempSrc: Temporal  PainSc: 5          Complications: No complications documented.

## 2020-03-09 ENCOUNTER — Encounter: Payer: Self-pay | Admitting: Gastroenterology

## 2020-03-09 LAB — SURGICAL PATHOLOGY

## 2020-03-09 NOTE — Anesthesia Postprocedure Evaluation (Signed)
Anesthesia Post Note  Patient: Roger Austin  Procedure(s) Performed: COLONOSCOPY (N/A )  Patient location during evaluation: PACU Anesthesia Type: General Level of consciousness: awake and alert Pain management: pain level controlled Vital Signs Assessment: post-procedure vital signs reviewed and stable Respiratory status: spontaneous breathing, nonlabored ventilation, respiratory function stable and patient connected to nasal cannula oxygen Cardiovascular status: blood pressure returned to baseline and stable Postop Assessment: no apparent nausea or vomiting Anesthetic complications: no   No complications documented.   Last Vitals:  Vitals:   03/08/20 1025 03/08/20 1034  BP: (!) 140/93 (!) 137/97  Pulse:    Resp:    Temp:    SpO2:      Last Pain:  Vitals:   03/09/20 0731  TempSrc:   PainSc: 0-No pain                 Yevette Edwards

## 2020-03-10 ENCOUNTER — Other Ambulatory Visit: Payer: Self-pay | Admitting: Neurology

## 2020-03-10 DIAGNOSIS — R0602 Shortness of breath: Secondary | ICD-10-CM

## 2020-03-10 DIAGNOSIS — G7000 Myasthenia gravis without (acute) exacerbation: Secondary | ICD-10-CM

## 2020-03-16 ENCOUNTER — Ambulatory Visit: Admission: RE | Admit: 2020-03-16 | Payer: PRIVATE HEALTH INSURANCE | Source: Ambulatory Visit

## 2020-03-17 ENCOUNTER — Ambulatory Visit: Admission: RE | Admit: 2020-03-17 | Payer: PRIVATE HEALTH INSURANCE | Source: Ambulatory Visit

## 2020-03-17 NOTE — Progress Notes (Signed)
Remote pacemaker transmission.   

## 2020-03-31 ENCOUNTER — Ambulatory Visit
Admission: RE | Admit: 2020-03-31 | Discharge: 2020-03-31 | Disposition: A | Discharge status: Home / Self Care | Payer: PRIVATE HEALTH INSURANCE | Source: Ambulatory Visit | Attending: Neurology | Admitting: Neurology

## 2020-03-31 ENCOUNTER — Other Ambulatory Visit: Payer: Self-pay

## 2020-03-31 DIAGNOSIS — K5730 Diverticulosis of large intestine without perforation or abscess without bleeding: Secondary | ICD-10-CM | POA: Insufficient documentation

## 2020-03-31 DIAGNOSIS — G7000 Myasthenia gravis without (acute) exacerbation: Secondary | ICD-10-CM | POA: Diagnosis present

## 2020-03-31 DIAGNOSIS — R0602 Shortness of breath: Secondary | ICD-10-CM | POA: Insufficient documentation

## 2020-03-31 DIAGNOSIS — K760 Fatty (change of) liver, not elsewhere classified: Secondary | ICD-10-CM | POA: Insufficient documentation

## 2020-03-31 DIAGNOSIS — I700 Atherosclerosis of aorta: Secondary | ICD-10-CM | POA: Diagnosis not present

## 2020-03-31 HISTORY — DX: Essential (primary) hypertension: I10

## 2020-03-31 LAB — POCT I-STAT CREATININE: Creatinine, Ser: 1.5 mg/dL — ABNORMAL HIGH (ref 0.61–1.24)

## 2020-03-31 MED ORDER — IOHEXOL 300 MG/ML  SOLN
100.0000 mL | Freq: Once | INTRAMUSCULAR | Status: AC | PRN
  Administered 2020-03-31: 100 mL via INTRAVENOUS

## 2020-06-01 ENCOUNTER — Ambulatory Visit (INDEPENDENT_AMBULATORY_CARE_PROVIDER_SITE_OTHER): Payer: PRIVATE HEALTH INSURANCE

## 2020-06-01 DIAGNOSIS — Z950 Presence of cardiac pacemaker: Secondary | ICD-10-CM

## 2020-06-02 LAB — CUP PACEART REMOTE DEVICE CHECK
Battery Impedance: 4322 Ohm
Battery Remaining Longevity: 12 mo
Battery Voltage: 2.7 V
Brady Statistic RA Percent Paced: 92 %
Date Time Interrogation Session: 20220323111136
Implantable Lead Implant Date: 19860501
Implantable Lead Location: 753859
Implantable Lead Model: 6957
Implantable Pulse Generator Implant Date: 20120420
Lead Channel Impedance Value: 0 Ohm
Lead Channel Impedance Value: 755 Ohm
Lead Channel Setting Pacing Amplitude: 2 V

## 2020-06-09 ENCOUNTER — Encounter: Payer: Self-pay | Admitting: *Deleted

## 2020-06-09 NOTE — Progress Notes (Signed)
Remote pacemaker transmission.   

## 2020-06-11 ENCOUNTER — Encounter: Payer: Self-pay | Admitting: Neurology

## 2020-06-11 ENCOUNTER — Ambulatory Visit (INDEPENDENT_AMBULATORY_CARE_PROVIDER_SITE_OTHER): Payer: No Typology Code available for payment source | Admitting: Neurology

## 2020-06-11 ENCOUNTER — Telehealth: Payer: Self-pay

## 2020-06-11 ENCOUNTER — Other Ambulatory Visit: Payer: Self-pay

## 2020-06-11 VITALS — BP 118/73 | HR 62 | Ht 72.0 in | Wt 216.0 lb

## 2020-06-11 DIAGNOSIS — R0609 Other forms of dyspnea: Secondary | ICD-10-CM

## 2020-06-11 DIAGNOSIS — R5382 Chronic fatigue, unspecified: Secondary | ICD-10-CM | POA: Diagnosis not present

## 2020-06-11 DIAGNOSIS — R0600 Dyspnea, unspecified: Secondary | ICD-10-CM | POA: Diagnosis not present

## 2020-06-11 NOTE — Telephone Encounter (Signed)
Secure chat received from Dr. Lanney Gins and Dr.Arida  Dr. Lanney Gins requested that Dr. Fletcher Anon call him to discuss the patient.  Dr. Fletcher Anon "Roger Austin, can you please schedule this patient for an office follow-up visit with me as most likely he will require repeat cardiac cath. Thanks."  I talked with Dr. Fletcher Anon. He fells that this does not need to be an urgent appt. Pt should be scheduled in the next few weeks.  Called the patient to schedule an appt. Patient rqst a morning appt since he works in the afternoon. No availability with Dr. Fletcher Anon. Appt scheduled for 06/14/20 @ 10am with [name], NP. Patient made aware of the appt date, time, and, location. Adv him to arrive 15 min prior to the appt to allow for check in. Pt also adv the the medical art building entrance is now open.
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
## 2020-06-14 ENCOUNTER — Ambulatory Visit (INDEPENDENT_AMBULATORY_CARE_PROVIDER_SITE_OTHER): Payer: No Typology Code available for payment source | Admitting: Family

## 2020-06-14 ENCOUNTER — Other Ambulatory Visit: Payer: Self-pay

## 2020-06-14 ENCOUNTER — Encounter: Payer: Self-pay | Admitting: Family

## 2020-06-14 ENCOUNTER — Other Ambulatory Visit: Payer: Self-pay | Admitting: Urology

## 2020-06-14 VITALS — BP 120/80 | HR 81 | Ht 72.0 in | Wt 216.1 lb

## 2020-06-14 DIAGNOSIS — E785 Hyperlipidemia, unspecified: Secondary | ICD-10-CM

## 2020-06-14 DIAGNOSIS — I25118 Atherosclerotic heart disease of native coronary artery with other forms of angina pectoris: Secondary | ICD-10-CM | POA: Diagnosis not present

## 2020-06-14 DIAGNOSIS — Z950 Presence of cardiac pacemaker: Secondary | ICD-10-CM

## 2020-06-14 DIAGNOSIS — G4730 Sleep apnea, unspecified: Secondary | ICD-10-CM | POA: Diagnosis not present

## 2020-06-14 MED ORDER — NITROGLYCERIN 0.4 MG SL SUBL: 0.4000 mg | SUBLINGUAL_TABLET | SUBLINGUAL | 3 refills | Status: DC | PRN

## 2020-06-14 NOTE — Patient Instructions (Addendum)
Medication Instructions:  Your physician has recommended you make the following change in your medication:   START Nitroglycerin as needed for chest pain  Recommend trail of ibuprofen 600mg  twice daily for 3 days to see if it improves your chest pain.   *Take one tablet under the tongue for chest pain. Wait five minutes, if the chest pain persists take a second tablet. Wait another five minutes, if the chest pain persists take a third tablet. If you have to take a third tablet, we recommend evaluation in the emergency department*  *If you need a refill on your cardiac medications before your next appointment, please call your pharmacy*   Lab Work: Your provider recommends lab work today: BMP, CBC  If you have labs (blood work) drawn today and your tests are completely normal, you will receive your results only by: Marland Kitchen MyChart Message (if you have MyChart) OR . A paper copy in the mail If you have any lab test that is abnormal or we need to change your treatment, we will call you to review the results.   Testing/Procedures: Your physician has requested that you have a cardiac catheterization. Cardiac catheterization is used to diagnose and/or treat various heart conditions. Doctors may recommend this procedure for a number of different reasons. The most common reason is to evaluate chest pain. Chest pain can be a symptom of coronary artery disease (CAD), and cardiac catheterization can show whether plaque is narrowing or blocking your heart's arteries. This procedure is also used to evaluate the valves, as well as measure the blood flow and oxygen levels in different parts of your heart.  Please follow instruction sheet, as given.  [facility] Cardiac Cath Instructions   You are scheduled for a Cardiac Cath on: Friday April 8th with Dr. Fletcher Anon  Please arrive at 08:00 am on the day of your procedure  Please expect a call from our [place] to pre-register you  Do not  eat/drink anything after midnight  Someone will need to drive you home  It is recommended someone be with you for the first 24 hours after your procedure  Wear clothes that are easy to get on/off and wear slip on shoes if possible    Day of your procedure: Arrive at the [place] entrance.  Free valet service is available.  After entering the [facility] please check-in at the registration desk (1st desk on your right) to receive your armband. After receiving your armband someone will escort you to the cardiac cath/special procedures waiting area.  The usual length of stay after your procedure is about 2 to 3 hours.  This can vary.  If you have any questions, please call our office at [phone], or you may call the cardiac cath lab at [facility] directly at [place]- TEST: You will need a COVID TEST prior to the procedure:  LOCATION: [facility] Testing site.  DATE/TIME: Wednesday April 6th between (8:00 am- 10:00 am)    Follow-Up: At [facility], you and your health needs are our priority.  As part of our continuing mission to provide you with exceptional heart care, we have created designated Provider Care Teams.  These Care Teams include your primary Cardiologist (physician) and Advanced Practice Providers (APPs -  Physician Assistants and Nurse Practitioners) who all work together to provide you with the care you need, when you need it.   Your next appointment:   2-3 week(s)  The format  for your next appointment:   In Person  Provider:   You may see [name], MD or one of the following Advanced Practice Providers on your designated Care Team:    Murray Hodgkins, NP  Christell Faith, PA-C  Marrianne Mood, PA-C  Cadence Hilda, [place]  [name], NP  Other Instructions
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
## 2020-06-15 LAB — CBC
Hematocrit: 46.2 % (ref 37.5–51.0)
Hemoglobin: 15.2 g/dL (ref 13.0–17.7)
MCH: 30.8 pg (ref 26.6–33.0)
MCHC: 32.9 g/dL (ref 31.5–35.7)
MCV: 94 fL (ref 79–97)
Platelets: 272 10*3/uL (ref 150–450)
RBC: 4.93 x10E6/uL (ref 4.14–5.80)
RDW: 12.5 % (ref 11.6–15.4)
WBC: 6.8 10*3/uL (ref 3.4–10.8)

## 2020-06-15 LAB — BASIC METABOLIC PANEL
BUN/Creatinine Ratio: 15 (ref 10–24)
BUN: 17 mg/dL (ref 8–27)
CO2: 20 mmol/L (ref 20–29)
Calcium: 9.3 mg/dL (ref 8.6–10.2)
Chloride: 103 mmol/L (ref 96–106)
Creatinine, Ser: 1.12 mg/dL (ref 0.76–1.27)
Glucose: 87 mg/dL (ref 65–99)
Potassium: 4.4 mmol/L (ref 3.5–5.2)
Sodium: 139 mmol/L (ref 134–144)
eGFR: 72 mL/min/{1.73_m2} (ref >59)

## 2020-06-15 LAB — GLUTAMIC ACID DECARBOXYLASE AUTO ABS: Glutamic Acid Decarb Ab: <5 U/mL (ref 0.0–5.0)

## 2020-06-15 LAB — ANTI-HU, ANTI-RI, ANTI-YO IFA
Anti-Hu Ab: NEGATIVE
Anti-Ri Ab: NEGATIVE
Anti-Yo Ab: NEGATIVE

## 2020-06-15 LAB — HIV ANTIBODY (ROUTINE TESTING W REFLEX): HIV Screen 4th Generation wRfx: NONREACTIVE

## 2020-06-15 LAB — TESTOSTERONE, FREE, TOTAL, SHBG
Sex Hormone Binding: 46.8 nmol/L (ref 19.3–76.4)
Testosterone, Free: 3.6 pg/mL — ABNORMAL LOW (ref 6.6–18.1)
Testosterone: 352 ng/dL (ref 264–916)

## 2020-06-15 LAB — COPPER, SERUM: Copper: 108 ug/dL (ref 69–132)

## 2020-06-15 LAB — LACTIC ACID, PLASMA: Lactate, Ven: 11 mg/dL (ref 4.8–25.7)

## 2020-06-16 ENCOUNTER — Other Ambulatory Visit
Admission: RE | Admit: 2020-06-16 | Discharge: 2020-06-16 | Disposition: A | Discharge status: Home / Self Care | Payer: PRIVATE HEALTH INSURANCE | Source: Ambulatory Visit | Attending: Cardiovascular Disease | Admitting: Cardiovascular Disease

## 2020-06-16 ENCOUNTER — Other Ambulatory Visit: Payer: Self-pay

## 2020-06-16 DIAGNOSIS — Z20822 Contact with and (suspected) exposure to covid-19: Secondary | ICD-10-CM | POA: Diagnosis not present

## 2020-06-16 DIAGNOSIS — Z01812 Encounter for preprocedural laboratory examination: Secondary | ICD-10-CM | POA: Insufficient documentation

## 2020-06-16 LAB — SARS CORONAVIRUS 2 (TAT 6-24 HRS): SARS Coronavirus 2: NEGATIVE

## 2020-06-17 ENCOUNTER — Telehealth: Payer: Self-pay

## 2020-06-17 NOTE — Telephone Encounter (Addendum)
Dr. Tyrell Antonio outpatient caths for 06/18/20 were moved due to inpatient priority.   Patients 06/18/20 cardiac cath time has been changed to a 9:30 am procedure time. Patient will need to arrive at the [facility] medical mall 1 hour prior 8:30am for check in.   Patient is aware of the new procedure and arrival time. Patient voiced appreciation for the call.

## 2020-06-18 ENCOUNTER — Encounter: Payer: Self-pay | Admitting: Cardiovascular Disease

## 2020-06-18 ENCOUNTER — Ambulatory Visit
Admission: RE | Admit: 2020-06-18 | Discharge: 2020-06-18 | Disposition: A | Discharge status: Home / Self Care | Payer: PRIVATE HEALTH INSURANCE | Attending: Cardiovascular Disease | Admitting: Cardiovascular Disease

## 2020-06-18 ENCOUNTER — Encounter
Admission: RE | Disposition: A | Discharge status: Home / Self Care | Payer: No Typology Code available for payment source | Source: Home / Self Care | Attending: Cardiovascular Disease

## 2020-06-18 ENCOUNTER — Other Ambulatory Visit: Payer: Self-pay

## 2020-06-18 DIAGNOSIS — I25118 Atherosclerotic heart disease of native coronary artery with other forms of angina pectoris: Secondary | ICD-10-CM

## 2020-06-18 DIAGNOSIS — Z950 Presence of cardiac pacemaker: Secondary | ICD-10-CM | POA: Diagnosis not present

## 2020-06-18 DIAGNOSIS — Z880 Allergy status to penicillin: Secondary | ICD-10-CM | POA: Insufficient documentation

## 2020-06-18 DIAGNOSIS — Z79899 Other long term (current) drug therapy: Secondary | ICD-10-CM | POA: Diagnosis not present

## 2020-06-18 DIAGNOSIS — R0609 Other forms of dyspnea: Secondary | ICD-10-CM | POA: Diagnosis not present

## 2020-06-18 DIAGNOSIS — Z7982 Long term (current) use of aspirin: Secondary | ICD-10-CM | POA: Insufficient documentation

## 2020-06-18 DIAGNOSIS — G4730 Sleep apnea, unspecified: Secondary | ICD-10-CM | POA: Insufficient documentation

## 2020-06-18 DIAGNOSIS — I25119 Atherosclerotic heart disease of native coronary artery with unspecified angina pectoris: Secondary | ICD-10-CM | POA: Insufficient documentation

## 2020-06-18 DIAGNOSIS — Z955 Presence of coronary angioplasty implant and graft: Secondary | ICD-10-CM | POA: Insufficient documentation

## 2020-06-18 DIAGNOSIS — E785 Hyperlipidemia, unspecified: Secondary | ICD-10-CM | POA: Diagnosis not present

## 2020-06-18 DIAGNOSIS — R0600 Dyspnea, unspecified: Secondary | ICD-10-CM | POA: Diagnosis not present

## 2020-06-18 DIAGNOSIS — R9439 Abnormal result of other cardiovascular function study: Secondary | ICD-10-CM

## 2020-06-18 HISTORY — PX: RIGHT/LEFT HEART CATH AND CORONARY ANGIOGRAPHY: CATH118266

## 2020-06-18 SURGERY — RIGHT/LEFT HEART CATH AND CORONARY ANGIOGRAPHY
Anesthesia: Moderate Sedation | Laterality: Bilateral

## 2020-06-18 MED ORDER — VERAPAMIL HCL 2.5 MG/ML IV SOLN
INTRAVENOUS | Status: AC
  Filled 2020-06-18: qty 2

## 2020-06-18 MED ORDER — SODIUM CHLORIDE 0.9 % WEIGHT BASED INFUSION
3.0000 mL/kg/h | INTRAVENOUS | Status: AC
  Administered 2020-06-18: 3 mL/kg/h via INTRAVENOUS

## 2020-06-18 MED ORDER — VERAPAMIL HCL 2.5 MG/ML IV SOLN
INTRAVENOUS | Status: DC | PRN
  Administered 2020-06-18: 2.5 mg via INTRA_ARTERIAL

## 2020-06-18 MED ORDER — SODIUM CHLORIDE 0.9 % WEIGHT BASED INFUSION
1.0000 mL/kg/h | INTRAVENOUS | Status: DC
  Administered 2020-06-18: 1 mL/kg/h via INTRAVENOUS

## 2020-06-18 MED ORDER — HEPARIN SODIUM (PORCINE) 1000 UNIT/ML IJ SOLN
INTRAMUSCULAR | Status: AC
  Filled 2020-06-18: qty 1

## 2020-06-18 MED ORDER — ONDANSETRON HCL 4 MG/2ML IJ SOLN: 4.0000 mg | Freq: Four times a day (QID) | INTRAMUSCULAR | Status: DC | PRN

## 2020-06-18 MED ORDER — SODIUM CHLORIDE 0.9 % IV SOLN: 250.0000 mL | INTRAVENOUS | Status: DC | PRN

## 2020-06-18 MED ORDER — SODIUM CHLORIDE 0.9% FLUSH: 3.0000 mL | Freq: Two times a day (BID) | INTRAVENOUS | Status: DC

## 2020-06-18 MED ORDER — MIDAZOLAM HCL 2 MG/2ML IJ SOLN
INTRAMUSCULAR | Status: AC
  Filled 2020-06-18: qty 2

## 2020-06-18 MED ORDER — FENTANYL CITRATE (PF) 100 MCG/2ML IJ SOLN
INTRAMUSCULAR | Status: AC
  Filled 2020-06-18: qty 2

## 2020-06-18 MED ORDER — SODIUM CHLORIDE 0.9 % IV SOLN: INTRAVENOUS | Status: DC

## 2020-06-18 MED ORDER — FENTANYL CITRATE (PF) 100 MCG/2ML IJ SOLN
INTRAMUSCULAR | Status: DC | PRN
  Administered 2020-06-18: 50 ug via INTRAVENOUS

## 2020-06-18 MED ORDER — HEPARIN SODIUM (PORCINE) 1000 UNIT/ML IJ SOLN
INTRAMUSCULAR | Status: DC | PRN
Start: 2020-06-18 — End: 2020-06-18
  Administered 2020-06-18: 5000 [IU] via INTRAVENOUS

## 2020-06-18 MED ORDER — SODIUM CHLORIDE 0.9% FLUSH: 3.0000 mL | INTRAVENOUS | Status: DC | PRN

## 2020-06-18 MED ORDER — HEPARIN (PORCINE) IN NACL 1000-0.9 UT/500ML-% IV SOLN
INTRAVENOUS | Status: DC | PRN
  Administered 2020-06-18: 1000 mL

## 2020-06-18 MED ORDER — ACETAMINOPHEN 325 MG PO TABS: 650.0000 mg | ORAL_TABLET | ORAL | Status: DC | PRN

## 2020-06-18 MED ORDER — ASPIRIN 81 MG PO CHEW
CHEWABLE_TABLET | ORAL | Status: AC
  Filled 2020-06-18: qty 1

## 2020-06-18 MED ORDER — IOHEXOL 300 MG/ML  SOLN
INTRAMUSCULAR | Status: DC | PRN
  Administered 2020-06-18: 60 mL

## 2020-06-18 MED ORDER — ASPIRIN 81 MG PO CHEW
81.0000 mg | CHEWABLE_TABLET | ORAL | Status: AC
  Administered 2020-06-18: 81 mg via ORAL

## 2020-06-18 MED ORDER — MIDAZOLAM HCL 2 MG/2ML IJ SOLN
INTRAMUSCULAR | Status: DC | PRN
  Administered 2020-06-18: 1 mg via INTRAVENOUS

## 2020-06-18 SURGICAL SUPPLY — 13 items
CATH 5F 110X4 TIG (CATHETERS) ×2 IMPLANT
CATH SWAN GANZ 7F STRAIGHT (CATHETERS) ×2 IMPLANT
DEVICE RAD TR BAND REGULAR (VASCULAR PRODUCTS) ×2 IMPLANT
DRAPE BRACHIAL (DRAPES) ×4 IMPLANT
GLIDESHEATH SLEND SS 6F .021 (SHEATH) ×2 IMPLANT
GLIDESHEATH SLENDER 7FR .021G (SHEATH) ×2 IMPLANT
GUIDEWIRE EMER 3M J .025X150CM (WIRE) ×2 IMPLANT
GUIDEWIRE INQWIRE 1.5J.035X260 (WIRE) ×1 IMPLANT
INQWIRE 1.5J .035X260CM (WIRE) ×2
PACK CARDIAC CATH (CUSTOM PROCEDURE TRAY) ×2 IMPLANT
PROTECTION STATION PRESSURIZED (MISCELLANEOUS) ×2
SET ATX SIMPLICITY (MISCELLANEOUS) ×2 IMPLANT
STATION PROTECTION PRESSURIZED (MISCELLANEOUS) ×1 IMPLANT

## 2020-06-18 NOTE — Interval H&P Note (Signed)
History and Physical Interval Note:  06/18/2020 10:47 AM  Roger Austin  has presented today for surgery, with the diagnosis of RT and LT Cath   Abnormal stress test.  The various methods of treatment have been discussed with the patient and family. After consideration of risks, benefits and other options for treatment, the patient has consented to  Procedure(s): RIGHT/LEFT HEART CATH AND CORONARY ANGIOGRAPHY (Bilateral) as a surgical intervention.  The patient's history has been reviewed, patient examined, no change in status, stable for surgery.  I have reviewed the patient's chart and labs.  Questions were answered to the patient's satisfaction.     Roger Austin

## 2020-06-19 LAB — ALA DELTA, 24-HOUR URINE
Delta Ala, 24H Ur: 1.1 mg/L
Urine Volume: 2.2 mg/24 hr (ref 0.5–5.1)

## 2020-06-19 LAB — PORPHOBILINOGEN, 24 HR URINE-QUANT
PBG Ur-sCnc: 0.6 mg/L (ref 0.0–2.0)
Porphobilinogen, 24H Ur: 1.2 mg/24 hr (ref 0.0–1.5)

## 2020-06-22 ENCOUNTER — Telehealth: Payer: Self-pay | Admitting: *Deleted

## 2020-06-22 NOTE — Telephone Encounter (Signed)
I called the patient.  The cardiac catheterization was unremarkable, no source of fatigue and shortness of breath noted.  If the patient is amenable to it, I will get a lumbar puncture to exclude demyelinating disease and look for evidence of sarcoidosis.  I suspect that the source of the fatigue and shortness of breath is likely to be elusive.

## 2020-06-22 NOTE — Telephone Encounter (Signed)
Returned patient's call.  He asked to inform Dr Jannifer Franklin that he had the catherization done and there were no findings of blockages.  Patient denied further questions, verbalized understanding and expressed appreciation for the phone call.

## 2020-06-22 NOTE — Telephone Encounter (Signed)
Pt called request call from the nurse.Please call [phone]

## 2020-07-06 ENCOUNTER — Ambulatory Visit: Payer: Medicare HMO | Admitting: Family

## 2020-07-08 ENCOUNTER — Encounter: Payer: Self-pay | Admitting: Family

## 2020-07-08 ENCOUNTER — Ambulatory Visit (INDEPENDENT_AMBULATORY_CARE_PROVIDER_SITE_OTHER): Payer: No Typology Code available for payment source | Admitting: Family

## 2020-07-08 ENCOUNTER — Other Ambulatory Visit: Payer: Self-pay

## 2020-07-08 VITALS — BP 122/92 | HR 63 | Ht 72.0 in | Wt 216.0 lb

## 2020-07-08 DIAGNOSIS — G4730 Sleep apnea, unspecified: Secondary | ICD-10-CM | POA: Diagnosis not present

## 2020-07-08 DIAGNOSIS — E785 Hyperlipidemia, unspecified: Secondary | ICD-10-CM | POA: Diagnosis not present

## 2020-07-08 DIAGNOSIS — Z950 Presence of cardiac pacemaker: Secondary | ICD-10-CM | POA: Diagnosis not present

## 2020-07-08 DIAGNOSIS — R0609 Other forms of dyspnea: Secondary | ICD-10-CM

## 2020-07-08 DIAGNOSIS — R0600 Dyspnea, unspecified: Secondary | ICD-10-CM

## 2020-07-08 DIAGNOSIS — I25118 Atherosclerotic heart disease of native coronary artery with other forms of angina pectoris: Secondary | ICD-10-CM

## 2020-07-08 NOTE — Patient Instructions (Addendum)
Medication Instructions:  Continue your current medications.   *If you need a refill on your cardiac medications before your next appointment, please call your pharmacy*  Lab Work: None ordered today.   Testing/Procedures: Your cardiac catheterization showed your previous stents are patent..   Follow-Up: At [facility], you and your health needs are our priority.  As part of our continuing mission to provide you with exceptional heart care, we have created designated Provider Care Teams.  These Care Teams include your primary Cardiologist (physician) and Advanced Practice Providers (APPs -  Physician Assistants and Nurse Practitioners) who all work together to provide you with the care you need, when you need it.  We recommend signing up for the patient portal called "MyChart".  Sign up information is provided on this After Visit Summary.  MyChart is used to connect with patients for Virtual Visits (Telemedicine).  Patients are able to view lab/test results, encounter notes, upcoming appointments, etc.  Non-urgent messages can be sent to your provider as well.   To learn more about what you can do with MyChart, go to [email or web address].    Your next appointment:   In July with Dr. [name] as scheduled AND In 4 months with Dr. Fletcher Anon  Other Instructions  Heart Healthy Diet Recommendations: A low-salt diet is recommended. Meats should be grilled, baked, or boiled. Avoid fried foods. Focus on lean protein sources like fish or chicken with vegetables and fruits. The American Heart Association is a Microbiologist!  American Heart Association Diet and Lifeystyle Recommendations   Exercise recommendations: The American Heart Association recommends 150 minutes of moderate intensity exercise weekly. Try 30 minutes of moderate intensity exercise 4-5 times per week. This could include walking, jogging, or swimming.    Pursed Lip Breathing Pursed lip breathing is a technique to relieve  the feeling of being short of breath.  How to perform pursed lip breathing Being short of breath can make you tense and anxious. Before you start this breathing exercise, take a minute to relax your shoulders and close your eyes. Then: 1. Start the exercise by closing your mouth. 2. Breathe in through your nose, taking a normal breath. You can do this at your normal rate of breathing. If you feel you are not getting enough air, breathe in while slowly counting to 2 or 3. 3. Pucker (purse) your lips as if you were going to whistle. 4. Gently tighten the muscles of your abdomenor press on your abdomen to help push the air out. 5. Breathe out slowly through your pursed lips. Take at least twice as long to breathe out as it takes you to breathe in. 6. Make sure that you breathe out all of the air, but do not force air out. 7. Repeat the exercise until your breathing improves. Ask your health care provider how often and how long to do this exercise.
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
## 2020-08-31 ENCOUNTER — Ambulatory Visit (INDEPENDENT_AMBULATORY_CARE_PROVIDER_SITE_OTHER): Payer: No Typology Code available for payment source

## 2020-08-31 DIAGNOSIS — I495 Sick sinus syndrome: Secondary | ICD-10-CM | POA: Diagnosis not present

## 2020-09-01 LAB — CUP PACEART REMOTE DEVICE CHECK
Battery Impedance: 5207 Ohm
Battery Remaining Longevity: 8 mo
Battery Voltage: 2.67 V
Brady Statistic RA Percent Paced: 92 %
Date Time Interrogation Session: 20220622142316
Implantable Lead Implant Date: 19860501
Implantable Lead Location: 753859
Implantable Lead Model: 6957
Implantable Pulse Generator Implant Date: 20120420
Lead Channel Impedance Value: 0 Ohm
Lead Channel Impedance Value: 747 Ohm
Lead Channel Setting Pacing Amplitude: 2 V

## 2020-09-09 ENCOUNTER — Telehealth: Payer: Self-pay

## 2020-09-09 NOTE — Telephone Encounter (Signed)
Advised patient of monthly battery checks and dates given to patient. Questions answered. Appreciative of call.

## 2020-09-14 ENCOUNTER — Encounter: Payer: Self-pay | Admitting: Internal Medicine

## 2020-09-14 ENCOUNTER — Ambulatory Visit (INDEPENDENT_AMBULATORY_CARE_PROVIDER_SITE_OTHER): Payer: PRIVATE HEALTH INSURANCE | Admitting: Internal Medicine

## 2020-09-14 ENCOUNTER — Other Ambulatory Visit: Payer: Self-pay

## 2020-09-14 VITALS — BP 120/76 | HR 153 | Ht 72.0 in | Wt 207.6 lb

## 2020-09-14 DIAGNOSIS — Z950 Presence of cardiac pacemaker: Secondary | ICD-10-CM

## 2020-09-14 DIAGNOSIS — I10 Essential (primary) hypertension: Secondary | ICD-10-CM | POA: Diagnosis not present

## 2020-09-14 DIAGNOSIS — I495 Sick sinus syndrome: Secondary | ICD-10-CM

## 2020-09-14 NOTE — Patient Instructions (Signed)
Medication Instructions:  - Your physician recommends that you continue on your current medications as directed. Please refer to the Current Medication list given to you today.  *If you need a refill on your cardiac medications before your next appointment, please call your pharmacy*   Lab Work: - none ordered  If you have labs (blood work) drawn today and your tests are completely normal, you will receive your results only by: [place] (if you have MyChart) OR A paper copy in the mail If you have any lab test that is abnormal or we need to change your treatment, we will call you to review the results.   Testing/Procedures: - none ordered   Follow-Up: At [facility], you and your health needs are our priority.  As part of our continuing mission to provide you with exceptional heart care, we have created designated Provider Care Teams.  These Care Teams include your primary Cardiologist (physician) and Advanced Practice Providers (APPs -  Physician Assistants and Nurse Practitioners) who all work together to provide you with the care you need, when you need it.  We recommend signing up for the patient portal called "MyChart".  Sign up information is provided on this After Visit Summary.  MyChart is used to connect with patients for Virtual Visits (Telemedicine).  Patients are able to view lab/test results, encounter notes, upcoming appointments, etc.  Non-urgent messages can be sent to your provider as well.   To learn more about what you can do with MyChart, go to [email or web address].    Your next appointment:   6 month(s)  The format for your next appointment:   In Person  Provider:   Virl Axe, MD   Other Instructions N/a
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
## 2020-09-20 NOTE — Progress Notes (Signed)
Remote pacemaker transmission.   

## 2020-09-27 ENCOUNTER — Telehealth: Payer: Self-pay | Admitting: Internal Medicine

## 2020-09-27 NOTE — Telephone Encounter (Signed)
Advised pt that we have not received a new transmission since the 7/5 OV so cannot provide an update on lead function.  Next scheduled transmission is 10/11/20

## 2020-09-27 NOTE — Telephone Encounter (Signed)
Patient calling in to check on update regarding his pacemaker and the lead concerns he experiencing. Please advise patient when able

## 2020-10-11 ENCOUNTER — Ambulatory Visit (INDEPENDENT_AMBULATORY_CARE_PROVIDER_SITE_OTHER): Payer: No Typology Code available for payment source

## 2020-10-11 DIAGNOSIS — I495 Sick sinus syndrome: Secondary | ICD-10-CM

## 2020-10-12 LAB — CUP PACEART REMOTE DEVICE CHECK
Battery Impedance: 5878 Ohm
Battery Remaining Longevity: 5 mo
Battery Voltage: 2.64 V
Brady Statistic RA Percent Paced: 99 %
Date Time Interrogation Session: 20220801045645
Implantable Lead Implant Date: 19860501
Implantable Lead Location: 753859
Implantable Lead Model: 6957
Implantable Pulse Generator Implant Date: 20120420
Lead Channel Impedance Value: 0 Ohm
Lead Channel Impedance Value: 781 Ohm
Lead Channel Setting Pacing Amplitude: 2 V

## 2020-10-13 ENCOUNTER — Ambulatory Visit (INDEPENDENT_AMBULATORY_CARE_PROVIDER_SITE_OTHER): Payer: No Typology Code available for payment source | Admitting: Neurology

## 2020-10-13 ENCOUNTER — Telehealth: Payer: Self-pay

## 2020-10-13 ENCOUNTER — Encounter: Payer: Self-pay | Admitting: Neurology

## 2020-10-13 VITALS — BP 130/85 | HR 62 | Ht 72.0 in | Wt 204.0 lb

## 2020-10-13 DIAGNOSIS — M791 Myalgia, unspecified site: Secondary | ICD-10-CM

## 2020-10-13 DIAGNOSIS — R0600 Dyspnea, unspecified: Secondary | ICD-10-CM

## 2020-10-13 DIAGNOSIS — R0609 Other forms of dyspnea: Secondary | ICD-10-CM

## 2020-10-13 MED ORDER — MODAFINIL 200 MG PO TABS: 200.0000 mg | ORAL_TABLET | Freq: Every day | ORAL | 1 refills | Status: DC

## 2020-10-13 NOTE — Telephone Encounter (Signed)
PA for modafinil has been sent via fax to Rx benefits.  Fax  # [phone].  Will follow this request and update accordingly.
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
## 2020-10-13 NOTE — Telephone Encounter (Signed)
Fax PA for Modafinil has been filled out and placed on Dr. Jannifer Franklin desk for signature.  One signed will fax to the appropriate number.

## 2020-10-14 ENCOUNTER — Encounter: Payer: Self-pay | Admitting: Plastic Surgery

## 2020-10-14 ENCOUNTER — Ambulatory Visit (INDEPENDENT_AMBULATORY_CARE_PROVIDER_SITE_OTHER): Payer: PRIVATE HEALTH INSURANCE | Admitting: Plastic Surgery

## 2020-10-14 ENCOUNTER — Other Ambulatory Visit: Payer: Self-pay

## 2020-10-14 VITALS — BP 133/81 | HR 64 | Ht 72.0 in | Wt 206.2 lb

## 2020-10-14 DIAGNOSIS — M6208 Separation of muscle (nontraumatic), other site: Secondary | ICD-10-CM
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
## 2020-10-18 NOTE — Telephone Encounter (Signed)
Additional questions were sent for the is PA. Questionnaire completed and faxed back will update accordingly.

## 2020-10-20 ENCOUNTER — Encounter: Payer: Self-pay | Admitting: Surgery

## 2020-10-20 ENCOUNTER — Ambulatory Visit (INDEPENDENT_AMBULATORY_CARE_PROVIDER_SITE_OTHER): Payer: No Typology Code available for payment source | Admitting: Surgery

## 2020-10-20 ENCOUNTER — Other Ambulatory Visit: Payer: Self-pay

## 2020-10-20 VITALS — BP 134/88 | HR 62 | Temp 98.1°F | Ht 72.0 in | Wt 206.0 lb

## 2020-10-20 DIAGNOSIS — R1013 Epigastric pain: Secondary | ICD-10-CM

## 2020-10-20 DIAGNOSIS — G7000 Myasthenia gravis without (acute) exacerbation: Secondary | ICD-10-CM | POA: Insufficient documentation

## 2020-10-20 DIAGNOSIS — G8929 Other chronic pain: Secondary | ICD-10-CM

## 2020-10-20 DIAGNOSIS — M6208 Separation of muscle (nontraumatic), other site: Secondary | ICD-10-CM

## 2020-10-20 NOTE — Patient Instructions (Addendum)
We did not find any hernia on exam. We will send a message to Dr Jefm Bryant about our thoughts about your exam.   Follow-up with our office as needed.  Please call and ask to speak with a nurse if you develop questions or concerns.   Diastasis Recti  Diastasis recti is a condition in which the muscles of the abdomen (rectus abdominis muscles) become thin and separate. The result is a wider space between the muscles of the right and left abdomen (abdominal muscles). This wider space between the muscles may cause a bulge in the middle of the abdomen. This bulge may be noticed when a person is straining or when he or shesits up after lying down. Diastasis recti can affect men and women. It is most common among pregnant women, babies, people with obesity, and people who have had abdominal surgery.Exercise or surgery may help correct this condition. What are the causes? Common causes of this condition include: Pregnancy. As the uterus grows in size, it puts pressure on the abdominal muscles, causing the muscles to separate. Obesity. Excess fat puts pressure on abdominal muscles. Weight lifting. Some exercises of the abdomen. Advanced age. Genetics. Having had surgery on the abdomen before. What increases the risk? This condition is more likely to develop in: Women. Newborns, especially newborns who are born early (prematurely). What are the signs or symptoms? Common symptoms of this condition include: A bulge in the middle of your abdomen. You will notice it most when you sit up or strain. Pain in your low back, hips, or the area between your hip bones (pelvis). Constipation. Being unable to control when you urinate (urinary incontinence). Bloating. Poor posture. How is this diagnosed? This condition is diagnosed with a physical exam. During the exam, your health care provider will ask you to lie flat on your back and do a crunch or half sit-up. If you have diastasis recti, a bulge will  appear lengthwise between your abdominal muscles in the center of your abdomen. Your health care provider will measure the gap between your muscles with one of the following: A medical device used to measure the space between two objects (caliper). A tape measure. CT scan. Ultrasound. Finger spaces. Your health care provider will measure the space using his or her fingers. How is this treated? If your muscle separation is not too large, you may not need treatment. However, if you are a woman who plans to become pregnant again, you should treat this condition before your next pregnancy. Treatment may include: Physical therapy exercises to strengthen and tighten your abdominal muscles. Lifestyle changes such as weight loss and exercise. Over-the-counter pain medicines as needed. Surgery to correct the separation. Follow these instructions at home: Activity Return to your normal activities as told by your health care provider. Ask your health care provider what activities are safe for you. Do exercises as told by your health care provider. Make sure you are doing your exercises and movements correctly when lifting weights or doing exercises using your abdominal muscles or the muscles in the center of your body that give stability (core muscles). Proper form can help to prevent this condition from happening again. General instructions If you are overweight, ask your health care provider for help with weight loss. Losing even a small amount of weight can help to improve your diastasis recti. Take over-the-counter or prescription medicines only as told by your health care provider. Do not strain. Straining can make the separation worse. Examples of straining include:  Pushing hard to have a bowel movement, such as when you have constipation. Lifting heavy objects or lifting children. Standing up and sitting down. You may need to take these actions to prevent or treat constipation: Drink enough fluid  to keep your urine pale yellow. Take over-the-counter or prescription medicines. Eat foods that are high in fiber, such as beans, whole grains, and fresh fruits and vegetables. Limit foods that are high in fat and processed sugars, such as fried or sweet foods. Keep all follow-up visits. This is important. Contact a health care provider if: You notice a new bulge in your abdomen. Get help right away if: You experience severe discomfort in your abdomen. You develop severe abdominal pain along with nausea, vomiting, or a fever. Summary Diastasis recti is a condition in which the muscles of the abdomen (rectus abdominismuscles) become thin and separate. You may notice a bulge in your abdomen because the space has widened between the muscles of the right and left abdomen. The most common symptom is a bulge in the middle of your abdomen. You will notice it most when you sit up or strain. This condition is diagnosed with a physical exam. If the muscle separation is not too big, you may not need treatment. Otherwise, you may need to do physical therapy or have surgery. This information is not intended to replace advice given to you by your health care provider. Make sure you discuss any questions you have with your healthcare provider. Document Revised: 10/31/2019 Document Reviewed: 10/31/2019 Elsevier Patient Education  [place].

## 2020-10-21 ENCOUNTER — Other Ambulatory Visit: Payer: Self-pay

## 2020-10-21 ENCOUNTER — Ambulatory Visit (INDEPENDENT_AMBULATORY_CARE_PROVIDER_SITE_OTHER): Payer: PRIVATE HEALTH INSURANCE | Admitting: Physician Assistant

## 2020-10-21 ENCOUNTER — Encounter: Payer: Self-pay | Admitting: Physician Assistant

## 2020-10-21 VITALS — BP 110/70 | HR 60 | Ht 72.0 in | Wt 205.2 lb

## 2020-10-21 DIAGNOSIS — R0602 Shortness of breath: Secondary | ICD-10-CM | POA: Diagnosis not present

## 2020-10-21 DIAGNOSIS — I10 Essential (primary) hypertension: Secondary | ICD-10-CM | POA: Diagnosis not present

## 2020-10-21 DIAGNOSIS — E785 Hyperlipidemia, unspecified: Secondary | ICD-10-CM

## 2020-10-21 DIAGNOSIS — I495 Sick sinus syndrome: Secondary | ICD-10-CM | POA: Diagnosis not present

## 2020-10-21 DIAGNOSIS — I25118 Atherosclerotic heart disease of native coronary artery with other forms of angina pectoris: Secondary | ICD-10-CM | POA: Diagnosis not present

## 2020-10-21 DIAGNOSIS — Z950 Presence of cardiac pacemaker: Secondary | ICD-10-CM

## 2020-10-21 DIAGNOSIS — G4730 Sleep apnea, unspecified: Secondary | ICD-10-CM

## 2020-10-21 NOTE — Patient Instructions (Signed)
Medication Instructions:  Please continue your current cardiac medications   *If you need a refill on your cardiac medications before your next appointment, please call your pharmacy*   Lab Work: None   Testing/Procedures: ECHO Your physician has requested that you have an echocardiogram. Echocardiography is a painless test that uses sound waves to create images of your heart. It provides your doctor with information about the size and shape of your heart and how well your heart's chambers and valves are working. This procedure takes approximately one hour. There are no restrictions for this procedure.  There is a possibility that an IV may need to be started during your test to inject an image enhancing agent. This is done to obtain more optimal pictures of your heart. Therefore we ask that you do at least drink some water prior to coming in to hydrate your veins.     Follow-Up: At [facility], you and your health needs are our priority.  As part of our continuing mission to provide you with exceptional heart care, we have created designated Provider Care Teams.  These Care Teams include your primary Cardiologist (physician) and Advanced Practice Providers (APPs -  Physician Assistants and Nurse Practitioners) who all work together to provide you with the care you need, when you need it.  Your next appointment:   8 week(s)  The format for your next appointment:   In Person  Provider:   Virl Axe, MD

## 2020-10-21 NOTE — Telephone Encounter (Signed)
PA decision received and PA was denied.  I have asked pt if he was able to pick this medication up at the time of med being sent.  If not, pt may qualify for goodrx coupon to help lower the price of the medication.
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
## 2020-11-03 NOTE — Addendum Note (Signed)
Addended by: Douglass Rivers D on: 11/03/2020 07:40 PM   Modules accepted: Level of Service

## 2020-11-03 NOTE — Progress Notes (Signed)
Remote pacemaker transmission.   

## 2020-11-11 ENCOUNTER — Ambulatory Visit (INDEPENDENT_AMBULATORY_CARE_PROVIDER_SITE_OTHER): Payer: No Typology Code available for payment source

## 2020-11-11 DIAGNOSIS — I495 Sick sinus syndrome: Secondary | ICD-10-CM

## 2020-11-16 LAB — CUP PACEART REMOTE DEVICE CHECK
Battery Impedance: 7649 Ohm
Battery Remaining Longevity: 1 mo — CL
Battery Voltage: 2.61 V
Brady Statistic RA Percent Paced: 99 %
Date Time Interrogation Session: 20220831205736
Implantable Lead Implant Date: 19860501
Implantable Lead Location: 753859
Implantable Lead Model: 6957
Implantable Pulse Generator Implant Date: 20120420
Lead Channel Impedance Value: 0 Ohm
Lead Channel Impedance Value: 818 Ohm
Lead Channel Setting Pacing Amplitude: 2 V

## 2020-11-23 ENCOUNTER — Ambulatory Visit (INDEPENDENT_AMBULATORY_CARE_PROVIDER_SITE_OTHER): Payer: PRIVATE HEALTH INSURANCE

## 2020-11-23 ENCOUNTER — Other Ambulatory Visit: Payer: Self-pay

## 2020-11-23 DIAGNOSIS — R0602 Shortness of breath: Secondary | ICD-10-CM | POA: Diagnosis not present

## 2020-11-23 LAB — ECHOCARDIOGRAM COMPLETE
AR max vel: 3.36 cm2
AV Area VTI: 3.17 cm2
AV Area mean vel: 3.38 cm2
AV Mean grad: 4 mmHg
AV Peak grad: 6.9 mmHg
Ao pk vel: 1.31 m/s
Area-P 1/2: 2.92 cm2
Calc EF: 76.7 %
S' Lateral: 2.1 cm
Single Plane A2C EF: 78 %
Single Plane A4C EF: 73.4 %

## 2020-11-23 MED ORDER — PERFLUTREN LIPID MICROSPHERE
1.0000 mL | INTRAVENOUS | Status: AC | PRN
Start: 2020-11-23 — End: 2020-11-23
  Administered 2020-11-23: 2 mL via INTRAVENOUS

## 2020-11-23 NOTE — Progress Notes (Signed)
Remote pacemaker transmission.   

## 2020-11-23 NOTE — Addendum Note (Signed)
Addended by: Cheri Kearns A on: 11/23/2020 02:28 PM   Modules accepted: Level of Service

## 2020-12-13 ENCOUNTER — Ambulatory Visit (INDEPENDENT_AMBULATORY_CARE_PROVIDER_SITE_OTHER): Payer: PRIVATE HEALTH INSURANCE

## 2020-12-13 DIAGNOSIS — I495 Sick sinus syndrome: Secondary | ICD-10-CM | POA: Diagnosis not present

## 2020-12-13 LAB — CUP PACEART REMOTE DEVICE CHECK
Battery Impedance: 9057 Ohm
Battery Voltage: 2.59 V
Brady Statistic RV Percent Paced: 99 %
Date Time Interrogation Session: 20221001180129
Implantable Lead Implant Date: 19860501
Implantable Lead Location: 753859
Implantable Lead Model: 6957
Implantable Pulse Generator Implant Date: 20120420
Lead Channel Impedance Value: 0 Ohm
Lead Channel Impedance Value: 731 Ohm
Lead Channel Setting Pacing Amplitude: 2 V
Lead Channel Setting Pacing Pulse Width: 0.4 ms
Lead Channel Setting Sensing Sensitivity: 4 mV

## 2020-12-14 ENCOUNTER — Telehealth: Payer: Self-pay

## 2020-12-14 ENCOUNTER — Ambulatory Visit (INDEPENDENT_AMBULATORY_CARE_PROVIDER_SITE_OTHER): Payer: PRIVATE HEALTH INSURANCE | Admitting: Internal Medicine

## 2020-12-14 ENCOUNTER — Encounter: Payer: Self-pay | Admitting: Internal Medicine

## 2020-12-14 ENCOUNTER — Other Ambulatory Visit: Payer: Self-pay

## 2020-12-14 ENCOUNTER — Encounter: Payer: Self-pay | Admitting: *Deleted

## 2020-12-14 ENCOUNTER — Telehealth: Payer: Self-pay | Admitting: Internal Medicine

## 2020-12-14 VITALS — BP 120/80 | HR 64 | Ht 72.0 in | Wt 200.0 lb

## 2020-12-14 DIAGNOSIS — Z950 Presence of cardiac pacemaker: Secondary | ICD-10-CM | POA: Diagnosis not present

## 2020-12-14 DIAGNOSIS — Z01818 Encounter for other preprocedural examination: Secondary | ICD-10-CM

## 2020-12-14 DIAGNOSIS — R0609 Other forms of dyspnea: Secondary | ICD-10-CM | POA: Diagnosis not present

## 2020-12-14 DIAGNOSIS — I495 Sick sinus syndrome: Secondary | ICD-10-CM | POA: Diagnosis not present

## 2020-12-14 DIAGNOSIS — I10 Essential (primary) hypertension: Secondary | ICD-10-CM | POA: Diagnosis not present

## 2020-12-14 DIAGNOSIS — Z01812 Encounter for preprocedural laboratory examination: Secondary | ICD-10-CM

## 2020-12-14 LAB — PACEMAKER DEVICE OBSERVATION

## 2020-12-14 MED ORDER — RANOLAZINE ER 500 MG PO TB12: 500.0000 mg | ORAL_TABLET | Freq: Two times a day (BID) | ORAL | 6 refills | Status: DC
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
## 2020-12-14 NOTE — Telephone Encounter (Signed)
The patient had an office visit with Dr. [name] today. Post visit, I received a secure chat message between he and Dr. Fletcher Anon as below:  Dr. [name]: Roger Austin wanted to let you know this guy has been having more chest discomfort anginal sounding since his catheterization in April. Empiric anti-ischemic therapy might make sense but wanted to get your input.  Dr. Fletcher Anon: I have not been able to really figure out this patient's symptoms which seem to be out of proportion to his diagnostic testing. The only thing left would be to do physiologic testing in the Cath Lab including CFR and IMR to look for endothelial dysfunction. I can discuss this with him again upon follow-up  Dr. [name]: Would you like me to start him on ranolazine to see how he does or just leave it for discussion  Dr. Fletcher Anon: Empiric ranolazine is a reasonable option at this time. If you can start that that would be great. Thanks   I have clarified with Dr. [name] that the patient should start on Ranexa 500 mg BID.  I called and spoke with the patient by phone and made him aware of MD recommendations as above. He voices understanding and is agreeable.  The patient currently has no follow up appointments scheduled with Dr. Fletcher Anon. He advised he would like to trial the Ranexa and see how he does. He is aware he can discuss with Dr. [name] at the time of his Gen Change on 01/14/21, but to call the office in the interim if needed. The patient voices understanding and is agreeable. He was appreciative for the call.

## 2020-12-14 NOTE — Telephone Encounter (Signed)
Scheduled remote reviewed. Normal device function.   The device has reached ERI, sent to triage.  Next remote to be determined.  Carelink alert received for ERI. Patient has appointment today with Dr. [name]. Device will be interrogated and gen change can be discussed.

## 2020-12-14 NOTE — Patient Instructions (Addendum)
Medication Instructions:  - Your physician recommends that you continue on your current medications as directed. Please refer to the Current Medication list given to you today.  *If you need a refill on your cardiac medications before your next appointment, please call your pharmacy*   Lab Work: - Your physician recommends that you have lab work today: BMP/ CBC  If you have labs (blood work) drawn today and your tests are completely normal, you will receive your results only by: MyChart Message (if you have MyChart) OR A paper copy in the mail If you have any lab test that is abnormal or we need to change your treatment, we will call you to review the results.   Testing/Procedures: - Your physician has recommended that you have a pacemaker generator (battery) change & new lead implant.    Follow-Up: At [facility], you and your health needs are our priority.  As part of our continuing mission to provide you with exceptional heart care, we have created designated Provider Care Teams.  These Care Teams include your primary Cardiologist (physician) and Advanced Practice Providers (APPs -  Physician Assistants and Nurse Practitioners) who all work together to provide you with the care you need, when you need it.  We recommend signing up for the patient portal called "MyChart".  Sign up information is provided on this After Visit Summary.  MyChart is used to connect with patients for Virtual Visits (Telemedicine).  Patients are able to view lab/test results, encounter notes, upcoming appointments, etc.  Non-urgent messages can be sent to your provider as well.   To learn more about what you can do with MyChart, go to [email or web address].    Your next appointment:   1) 10-14 days (from 01/14/21) - Device Clinic in the [place] office for a wound check  2) 91 days (from 01/14/21)- Dr. [name] in [place]    The format for your next appointment:   In Person  Provider:   As  above   Other Instructions N/a

## 2020-12-15 LAB — BASIC METABOLIC PANEL
BUN/Creatinine Ratio: 14 (ref 10–24)
BUN: 18 mg/dL (ref 8–27)
CO2: 22 mmol/L (ref 20–29)
Calcium: 10.1 mg/dL (ref 8.6–10.2)
Chloride: 105 mmol/L (ref 96–106)
Creatinine, Ser: 1.27 mg/dL (ref 0.76–1.27)
Glucose: 101 mg/dL — ABNORMAL HIGH (ref 70–99)
Potassium: 4.6 mmol/L (ref 3.5–5.2)
Sodium: 144 mmol/L (ref 134–144)
eGFR: 62 mL/min/{1.73_m2} (ref >59)

## 2020-12-15 LAB — CBC WITH DIFFERENTIAL/PLATELET
Basophils Absolute: 0.1 10*3/uL (ref 0.0–0.2)
Basos: 1 %
EOS (ABSOLUTE): 0.5 10*3/uL — ABNORMAL HIGH (ref 0.0–0.4)
Eos: 6 %
Hematocrit: 43.7 % (ref 37.5–51.0)
Hemoglobin: 15 g/dL (ref 13.0–17.7)
Immature Grans (Abs): 0.1 10*3/uL (ref 0.0–0.1)
Immature Granulocytes: 1 %
Lymphocytes Absolute: 1.6 10*3/uL (ref 0.7–3.1)
Lymphs: 20 %
MCH: 32.4 pg (ref 26.6–33.0)
MCHC: 34.3 g/dL (ref 31.5–35.7)
MCV: 94 fL (ref 79–97)
Monocytes Absolute: 0.8 10*3/uL (ref 0.1–0.9)
Monocytes: 10 %
Neutrophils Absolute: 5.2 10*3/uL (ref 1.4–7.0)
Neutrophils: 62 %
Platelets: 274 10*3/uL (ref 150–450)
RBC: 4.63 x10E6/uL (ref 4.14–5.80)
RDW: 13.8 % (ref 11.6–15.4)
WBC: 8.3 10*3/uL (ref 3.4–10.8)

## 2020-12-21 NOTE — Progress Notes (Signed)
Remote pacemaker transmission.   

## 2021-01-04 ENCOUNTER — Encounter: Payer: Medicare HMO | Admitting: Internal Medicine

## 2021-01-10 ENCOUNTER — Other Ambulatory Visit: Payer: Self-pay | Admitting: Internal Medicine

## 2021-01-10 DIAGNOSIS — Z45018 Encounter for adjustment and management of other part of cardiac pacemaker: Secondary | ICD-10-CM

## 2021-01-10 DIAGNOSIS — I495 Sick sinus syndrome: Secondary | ICD-10-CM

## 2021-01-13 ENCOUNTER — Ambulatory Visit (INDEPENDENT_AMBULATORY_CARE_PROVIDER_SITE_OTHER): Payer: No Typology Code available for payment source

## 2021-01-13 DIAGNOSIS — I495 Sick sinus syndrome: Secondary | ICD-10-CM

## 2021-01-13 LAB — CUP PACEART REMOTE DEVICE CHECK
Battery Impedance: 10455 Ohm
Battery Voltage: 2.57 V
Brady Statistic RV Percent Paced: 99 %
Date Time Interrogation Session: 20221103103031
Implantable Lead Implant Date: 19860501
Implantable Lead Location: 753859
Implantable Lead Model: 6957
Implantable Pulse Generator Implant Date: 20120420
Lead Channel Impedance Value: 0 Ohm
Lead Channel Impedance Value: 739 Ohm
Lead Channel Setting Pacing Amplitude: 2 V
Lead Channel Setting Pacing Pulse Width: 0.4 ms
Lead Channel Setting Sensing Sensitivity: 4 mV

## 2021-01-13 NOTE — Pre-Procedure Instructions (Signed)
Instructed patient on the following items: Arrival time 0730 Nothing to eat or drink after midnight No meds AM of procedure Responsible person to drive you home and stay with you for 24 hrs Wash with special soap night before and morning of procedure  

## 2021-01-14 ENCOUNTER — Ambulatory Visit (HOSPITAL_COMMUNITY)
Admission: RE | Disposition: A | Discharge status: Home / Self Care | Payer: Self-pay | Source: Home / Self Care | Attending: Internal Medicine

## 2021-01-14 ENCOUNTER — Encounter (HOSPITAL_COMMUNITY): Payer: Self-pay | Admitting: Internal Medicine

## 2021-01-14 ENCOUNTER — Ambulatory Visit (HOSPITAL_COMMUNITY): Payer: PRIVATE HEALTH INSURANCE

## 2021-01-14 ENCOUNTER — Other Ambulatory Visit: Payer: Self-pay

## 2021-01-14 ENCOUNTER — Ambulatory Visit (HOSPITAL_COMMUNITY)
Admission: RE | Admit: 2021-01-14 | Discharge: 2021-01-14 | Disposition: A | Discharge status: Home / Self Care | Payer: PRIVATE HEALTH INSURANCE | Attending: Internal Medicine | Admitting: Internal Medicine

## 2021-01-14 DIAGNOSIS — Z79899 Other long term (current) drug therapy: Secondary | ICD-10-CM | POA: Diagnosis not present

## 2021-01-14 DIAGNOSIS — Z45010 Encounter for checking and testing of cardiac pacemaker pulse generator [battery]: Secondary | ICD-10-CM | POA: Diagnosis not present

## 2021-01-14 DIAGNOSIS — Z959 Presence of cardiac and vascular implant and graft, unspecified: Secondary | ICD-10-CM

## 2021-01-14 DIAGNOSIS — Z45018 Encounter for adjustment and management of other part of cardiac pacemaker: Secondary | ICD-10-CM

## 2021-01-14 DIAGNOSIS — Z8249 Family history of ischemic heart disease and other diseases of the circulatory system: Secondary | ICD-10-CM | POA: Diagnosis not present

## 2021-01-14 DIAGNOSIS — G4730 Sleep apnea, unspecified: Secondary | ICD-10-CM | POA: Diagnosis not present

## 2021-01-14 DIAGNOSIS — I495 Sick sinus syndrome: Secondary | ICD-10-CM | POA: Diagnosis not present

## 2021-01-14 DIAGNOSIS — Z955 Presence of coronary angioplasty implant and graft: Secondary | ICD-10-CM | POA: Insufficient documentation

## 2021-01-14 DIAGNOSIS — Z880 Allergy status to penicillin: Secondary | ICD-10-CM | POA: Insufficient documentation

## 2021-01-14 DIAGNOSIS — I10 Essential (primary) hypertension: Secondary | ICD-10-CM | POA: Diagnosis not present

## 2021-01-14 DIAGNOSIS — I2510 Atherosclerotic heart disease of native coronary artery without angina pectoris: Secondary | ICD-10-CM | POA: Insufficient documentation

## 2021-01-14 DIAGNOSIS — Z8673 Personal history of transient ischemic attack (TIA), and cerebral infarction without residual deficits: Secondary | ICD-10-CM | POA: Insufficient documentation

## 2021-01-14 DIAGNOSIS — E785 Hyperlipidemia, unspecified: Secondary | ICD-10-CM | POA: Diagnosis not present

## 2021-01-14 DIAGNOSIS — Z7982 Long term (current) use of aspirin: Secondary | ICD-10-CM | POA: Insufficient documentation

## 2021-01-14 DIAGNOSIS — T82110A Breakdown (mechanical) of cardiac electrode, initial encounter: Secondary | ICD-10-CM | POA: Diagnosis not present

## 2021-01-14 HISTORY — PX: LEAD INSERTION: EP1212

## 2021-01-14 HISTORY — PX: PPM GENERATOR CHANGEOUT: EP1233

## 2021-01-14 SURGERY — PPM GENERATOR CHANGEOUT

## 2021-01-14 MED ORDER — LIDOCAINE HCL 1 % IJ SOLN
INTRAMUSCULAR | Status: AC
  Filled 2021-01-14: qty 60

## 2021-01-14 MED ORDER — MIDAZOLAM HCL 5 MG/5ML IJ SOLN
INTRAMUSCULAR | Status: DC | PRN
  Administered 2021-01-14: 2 mg via INTRAVENOUS
  Administered 2021-01-14 (×2): 1 mg via INTRAVENOUS

## 2021-01-14 MED ORDER — IOHEXOL 350 MG/ML SOLN
INTRAVENOUS | Status: DC | PRN
  Administered 2021-01-14: 18 mL via INTRAVENOUS

## 2021-01-14 MED ORDER — VANCOMYCIN HCL 1000 MG IV SOLR
INTRAVENOUS | Status: DC | PRN
  Administered 2021-01-14: 1000 mg via INTRAVENOUS

## 2021-01-14 MED ORDER — LIDOCAINE HCL (PF) 1 % IJ SOLN
INTRAMUSCULAR | Status: DC | PRN
  Administered 2021-01-14: 60 mL

## 2021-01-14 MED ORDER — SODIUM CHLORIDE 0.9 % IV SOLN
80.0000 mg | INTRAVENOUS | Status: AC
  Administered 2021-01-14: 80 mg

## 2021-01-14 MED ORDER — VANCOMYCIN HCL IN DEXTROSE 1-5 GM/200ML-% IV SOLN
INTRAVENOUS | Status: AC
  Filled 2021-01-14: qty 200

## 2021-01-14 MED ORDER — MIDAZOLAM HCL 5 MG/5ML IJ SOLN
INTRAMUSCULAR | Status: AC
  Filled 2021-01-14: qty 5

## 2021-01-14 MED ORDER — HEPARIN (PORCINE) IN NACL 1000-0.9 UT/500ML-% IV SOLN
INTRAVENOUS | Status: AC
  Filled 2021-01-14: qty 500

## 2021-01-14 MED ORDER — FENTANYL CITRATE (PF) 100 MCG/2ML IJ SOLN
INTRAMUSCULAR | Status: DC | PRN
  Administered 2021-01-14 (×3): 25 ug via INTRAVENOUS

## 2021-01-14 MED ORDER — HEPARIN (PORCINE) IN NACL 1000-0.9 UT/500ML-% IV SOLN
INTRAVENOUS | Status: DC | PRN
  Administered 2021-01-14: 500 mL

## 2021-01-14 MED ORDER — ACETAMINOPHEN 325 MG PO TABS
325.0000 mg | ORAL_TABLET | ORAL | Status: DC | PRN
  Filled 2021-01-14: qty 2

## 2021-01-14 MED ORDER — VANCOMYCIN HCL 1000 MG/200ML IV SOLN: 1000.0000 mg | INTRAVENOUS | Status: DC

## 2021-01-14 MED ORDER — SODIUM CHLORIDE 0.9 % IV SOLN
INTRAVENOUS | Status: AC
  Filled 2021-01-14: qty 2

## 2021-01-14 MED ORDER — SODIUM CHLORIDE 0.9 % IV SOLN: INTRAVENOUS | Status: DC

## 2021-01-14 MED ORDER — SODIUM CHLORIDE 0.9 % IV SOLN: INTRAVENOUS | Status: AC

## 2021-01-14 MED ORDER — FENTANYL CITRATE (PF) 100 MCG/2ML IJ SOLN
INTRAMUSCULAR | Status: AC
  Filled 2021-01-14: qty 2

## 2021-01-14 SURGICAL SUPPLY — 17 items
CABLE SURGICAL S-101-97-12 (CABLE) ×2 IMPLANT
CATH RIGHTSITE C315HIS02 (CATHETERS) ×1 IMPLANT
HEMOSTAT SURGICEL 2X4 FIBR (HEMOSTASIS) ×1 IMPLANT
IPG PACE AZUR XT DR MRI W1DR01 (Pacemaker) IMPLANT
KIT LEAD END CAP (Cap) IMPLANT
LEAD CAPSURE NOVUS 5076-52CM (Lead) ×1 IMPLANT
LEAD END CAP (Cap) ×1 IMPLANT
LEAD SELECT SECURE 3830 383069 (Lead) IMPLANT
PACE AZURE XT DR MRI W1DR01 (Pacemaker) ×2 IMPLANT
PAD PRO RADIOLUCENT 2001M-C (PAD) ×2 IMPLANT
POUCH AIGIS-R ANTIBACT PPM (Mesh General) ×2 IMPLANT
POUCH AIGIS-R ANTIBACT PPM MED (Mesh General) IMPLANT
SELECT SECURE 3830 383069 (Lead) ×2 IMPLANT
SHEATH 7FR PRELUDE SNAP 13 (SHEATH) ×2 IMPLANT
SLITTER 6232ADJ (MISCELLANEOUS) ×1 IMPLANT
TRAY PACEMAKER INSERTION (PACKS) ×2 IMPLANT
WIRE HITORQ VERSACORE ST 145CM (WIRE) ×1 IMPLANT

## 2021-01-14 NOTE — Progress Notes (Signed)
Patient and wife was given discharge instructions. Both verbalized understanding. 

## 2021-01-14 NOTE — Discharge Instructions (Signed)
After Your Pacemaker   You have a Medtronic Pacemaker  ACTIVITY Do not lift your arm above shoulder height for 1 week after your procedure. After 7 days, you may progress as below.  You should remove your sling 24 hours after your procedure, unless otherwise instructed by your provider.     Friday January 21, 2021  Saturday January 22, 2021 Sunday January 23, 2021 Monday January 24, 2021   Do not lift, push, pull, or carry anything over 10 pounds with the affected arm until 6 weeks (Friday February 25, 2021 ) after your procedure.   You may drive AFTER your wound check, unless you have been told otherwise by your provider.   Ask your healthcare provider when you can go back to work   INCISION/Dressing If you are on a blood thinner such as Coumadin, Xarelto, Eliquis, Plavix, or Pradaxa please confirm with your provider when this should be resumed.   If large square, outer bandage is left in place, this can be removed after 24 hours from your procedure. Do not remove steri-strips or glue as below.   Monitor your Pacemaker site for redness, swelling, and drainage. Call the device clinic at [phone] if you experience these symptoms or fever/chills.  If your incision is closed with Dermabond/Surgical glue. You may shower 1 day after your pacemaker implant and wash around the site with soap and water.    If you were discharged in a sling, please do not wear this during the day more than 48 hours after your surgery unless otherwise instructed. This may increase the risk of stiffness and soreness in your shoulder.   Avoid lotions, ointments, or perfumes over your incision until it is well-healed.  You may use a hot tub or a pool AFTER your wound check appointment if the incision is completely closed.  PAcemaker Alerts:  Some alerts are vibratory and others beep. These are NOT emergencies. Please call our office to let us know. If this occurs at night or on weekends, it can wait  until the next business day. Send a remote transmission.  If your device is capable of reading fluid status (for heart failure), you will be offered monthly monitoring to review this with you.   DEVICE MANAGEMENT Remote monitoring is used to monitor your pacemaker from home. This monitoring is scheduled every 91 days by our office. It allows [place] to keep an eye on the functioning of your device to ensure it is working properly. You will routinely see your Electrophysiologist annually (more often if necessary).   You should receive your ID card for your new device in 4-8 weeks. Keep this card with you at all times once received. Consider wearing a medical alert bracelet or necklace.  Your Pacemaker may be MRI compatible. This will be discussed at your next office visit/wound check.  You should avoid contact with strong electric or magnetic fields.   Do not use amateur (ham) radio equipment or electric (arc) welding torches. MP3 player headphones with magnets should not be used. Some devices are safe to use if held at least 12 inches (30 cm) from your Pacemaker. These include power tools, lawn mowers, and speakers. If you are unsure if something is safe to use, ask your health care provider.  When using your cell phone, hold it to the ear that is on the opposite side from the Pacemaker. Do not leave your cell phone in a pocket over the Pacemaker.  You may safely use electric blankets, heating  pads, computers, and microwave ovens.  Call the office right away if: You have chest pain. You feel more short of breath than you have felt before. You feel more light-headed than you have felt before. Your incision starts to open up.  This information is not intended to replace advice given to you by your health care provider. Make sure you discuss any questions you have with your health care provider.
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
## 2021-01-17 MED FILL — Lidocaine HCl Local Inj 1%: INTRAMUSCULAR | Qty: 60 | Status: AC

## 2021-01-17 MED FILL — Gentamicin Sulfate Inj 40 MG/ML: INTRAMUSCULAR | Qty: 80 | Status: AC

## 2021-01-18 ENCOUNTER — Encounter (HOSPITAL_COMMUNITY): Payer: Self-pay | Admitting: Internal Medicine

## 2021-01-19 NOTE — Progress Notes (Signed)
Remote pacemaker transmission.   

## 2021-01-20 ENCOUNTER — Telehealth: Payer: Self-pay | Admitting: *Deleted

## 2021-01-20 NOTE — Telephone Encounter (Signed)
    Patient Name: Roger Austin  DOB: 1955-09-05 MRN: [id number]  Primary Cardiologist: [name], MD  Chart reviewed as part of pre-operative protocol coverage.   Patient recently saw Dr. [name] 01/14/21 and underwent Gen change out. Per note "He is also having more problems with chest discomfort.  I will review this with Dr. Audelia Acton.  He was cath 6 months ago but in his mind symptoms are worsening.   I have asked for Dr. Lynann Beaver input; my formation would be, rather than proceeding with catheterization to try an empiric anti-ischemic approach and with his history of orthostasis, would recommend that we try ranolazine".  Last cath 06/18/20: Significant one-vessel coronary artery disease with patent RCA stents.  There is a 70% stenosis in the mid/distal right coronary artery supplying a small area.  Vessel diameter is 2 mm at the site of stenosis.  No significant disease affecting the left coronary system.  Codominant coronary arteries.  Is patient is okay for thumb surgery and hold aspirin?  Please forward your response to [facility] PREOP.   Thank you    Leanor Kail, PA 01/20/2021, 12:24 PM

## 2021-01-20 NOTE — Telephone Encounter (Signed)
   Pre-operative Risk Assessment    Patient Name: Roger Austin  DOB: [birth year] MRN: [id number]      Request for Surgical Clearance   Procedure:   LEFT THUMB [facility] ARTHROPLASTY WITH DOUBLE TENDON TRANSFER AND PARTIAL  TRAPEZOID RESECTION WITH REPAIR IF NECESSARY  Date of Surgery: Clearance 03/15/21                                 Surgeon:  DR. Roseanne Kaufman Surgeon's Group or Practice Name:  [facility] Phone number:  [phone] Fax number:  [phone] ATTN: Judeen Hammans WILLS   Type of Clearance Requested: - Medical  - Pharmacy:  Hold Aspirin     Type of Anesthesia:   ANESTHESIA-BLOCK WITH IV SEDATION   Additional requests/questions:   Jiles Prows   01/20/2021, 11:49 AM

## 2021-01-21 NOTE — Telephone Encounter (Signed)
Low risk for surgery but should continue aspirin 81 mg once daily if possible.

## 2021-01-26 ENCOUNTER — Other Ambulatory Visit: Payer: Self-pay

## 2021-01-26 ENCOUNTER — Ambulatory Visit (INDEPENDENT_AMBULATORY_CARE_PROVIDER_SITE_OTHER): Payer: PRIVATE HEALTH INSURANCE

## 2021-01-26 DIAGNOSIS — I495 Sick sinus syndrome: Secondary | ICD-10-CM | POA: Diagnosis not present

## 2021-01-26 LAB — CUP PACEART INCLINIC DEVICE CHECK
Battery Remaining Longevity: 146 mo
Battery Voltage: 3.22 V
Brady Statistic AP VP Percent: 0.37 %
Brady Statistic AP VS Percent: 98.42 %
Brady Statistic AS VP Percent: 0.03 %
Brady Statistic AS VS Percent: 1.18 %
Brady Statistic RA Percent Paced: 99.26 %
Brady Statistic RV Percent Paced: 0.4 %
Date Time Interrogation Session: 20221116092224
Implantable Lead Implant Date: 20221104
Implantable Lead Implant Date: 20221104
Implantable Lead Location: 753859
Implantable Lead Location: 753860
Implantable Lead Model: 3830
Implantable Lead Model: 5076
Implantable Pulse Generator Implant Date: 20221104
Lead Channel Impedance Value: 380 Ohm
Lead Channel Impedance Value: 532 Ohm
Lead Channel Impedance Value: 532 Ohm
Lead Channel Impedance Value: 741 Ohm
Lead Channel Pacing Threshold Amplitude: 0.5 V
Lead Channel Pacing Threshold Amplitude: 0.75 V
Lead Channel Pacing Threshold Pulse Width: 0.4 ms
Lead Channel Pacing Threshold Pulse Width: 0.4 ms
Lead Channel Sensing Intrinsic Amplitude: 1 mV
Lead Channel Sensing Intrinsic Amplitude: 31.625 mV
Lead Channel Setting Pacing Amplitude: 3.5 V
Lead Channel Setting Pacing Amplitude: 3.5 V
Lead Channel Setting Pacing Pulse Width: 0.4 ms
Lead Channel Setting Sensing Sensitivity: 0.9 mV

## 2021-01-26 NOTE — Patient Instructions (Signed)
   After Your Pacemaker   Monitor your pacemaker site for redness, swelling, and drainage. Call the device clinic at [phone] if you experience these symptoms or fever/chills.  Your incision was closed with Dermabond:  You may shower 1 day after your defibrillator implant and wash your incision with soap and water. Avoid lotions, ointments, or perfumes over your incision until it is well-healed.  You may use a hot tub or a pool after your wound check appointment if the incision is completely closed.  Do not lift, push or pull greater than 10 pounds with the affected arm until 6 weeks after your procedure. There are no other restrictions in arm movement after your wound check appointment. December 16  You may drive, unless driving has been restricted by your healthcare providers.  Your Pacemaker is MRI compatible.  Remote monitoring is used to monitor your pacemaker from home. This monitoring is scheduled every 91 days by our office. It allows [place] to keep an eye on the functioning of your device to ensure it is working properly. You will routinely see your Electrophysiologist annually (more often if necessary).

## 2021-01-26 NOTE — Progress Notes (Signed)
Wound check appointment s/p gen change with new lead implants 01/14/21. Dermabond removed. Wound without redness or edema. Incision edges approximated, wound well healed. Normal device function. Thresholds, sensing, and impedances consistent with implant measurements. Device programmed at 3.5V/auto capture programmed on for extra safety margin until 3 month visit. Histogram distribution appropriate for patient and level of activity. 1 NSVT episode which appears to start as 1:1 lasting <1 second. Device implant for SA node dysfunction. Patient is enrolled in remote monitoring and assisted with sending transmission via app today. Next transmission scheduled 04/19/21. 91 day follow up with Dr. [name] 03/22/21. Patient educated about wound care, arm mobility, lifting restrictions.

## 2021-02-21 ENCOUNTER — Encounter: Payer: Self-pay | Admitting: Neurology

## 2021-02-21 ENCOUNTER — Ambulatory Visit (INDEPENDENT_AMBULATORY_CARE_PROVIDER_SITE_OTHER): Payer: No Typology Code available for payment source | Admitting: Neurology

## 2021-02-21 VITALS — BP 138/83 | HR 80 | Ht 72.0 in | Wt 205.0 lb

## 2021-02-21 DIAGNOSIS — R5382 Chronic fatigue, unspecified: Secondary | ICD-10-CM | POA: Diagnosis not present

## 2021-02-21 DIAGNOSIS — M791 Myalgia, unspecified site: Secondary | ICD-10-CM

## 2021-02-21 DIAGNOSIS — R531 Weakness: Secondary | ICD-10-CM | POA: Insufficient documentation

## 2021-02-21 DIAGNOSIS — R0609 Other forms of dyspnea: Secondary | ICD-10-CM | POA: Diagnosis not present
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
## 2021-03-01 LAB — ACHR ALL WITH REFLEX TO MUSK
AChR Binding Ab, Serum: <0.03 nmol/L (ref 0.00–0.24)
Acetylchol Block Ab: 13 % (ref 0–25)

## 2021-03-01 LAB — CK: Total CK: 166 U/L (ref 41–331)

## 2021-03-01 LAB — MUSK ANTIBODIES: MuSK Antibodies: <1 U/mL

## 2021-03-02 ENCOUNTER — Telehealth: Payer: Self-pay

## 2021-03-02 NOTE — Telephone Encounter (Signed)
-----   Message from Penni Bombard, MD sent at 03/01/2021  5:34 PM EST ----- Normal labs. Please call patient. -VRP

## 2021-03-02 NOTE — Telephone Encounter (Signed)
Pt verified by name and DOB,  normal results given per provider, pt voiced understanding all question answered. °

## 2021-03-09 ENCOUNTER — Telehealth: Payer: Self-pay | Admitting: Neurology

## 2021-03-09 NOTE — Telephone Encounter (Signed)
patient has a decive   PHCS auth: [id number] (exp. March 18, 2021 to 06/01/21) order sent to [facility], they will reach out to the patient to schedule.  [place]: [id number] for both CPT codes [phone] & [phone] (exp. 2021-03-18 to 08/30/21).

## 2021-03-22 ENCOUNTER — Other Ambulatory Visit: Payer: Self-pay

## 2021-03-22 ENCOUNTER — Encounter: Payer: Self-pay | Admitting: Internal Medicine

## 2021-03-22 ENCOUNTER — Ambulatory Visit (INDEPENDENT_AMBULATORY_CARE_PROVIDER_SITE_OTHER): Payer: BC Managed Care – PPO | Admitting: Internal Medicine

## 2021-03-22 VITALS — BP 138/86 | HR 65 | Ht 72.0 in | Wt 200.0 lb

## 2021-03-22 DIAGNOSIS — I495 Sick sinus syndrome: Secondary | ICD-10-CM

## 2021-03-22 DIAGNOSIS — I951 Orthostatic hypotension: Secondary | ICD-10-CM

## 2021-03-22 DIAGNOSIS — I498 Other specified cardiac arrhythmias: Secondary | ICD-10-CM

## 2021-03-22 DIAGNOSIS — Z950 Presence of cardiac pacemaker: Secondary | ICD-10-CM | POA: Diagnosis not present

## 2021-03-22 LAB — PACEMAKER DEVICE OBSERVATION

## 2021-03-22 NOTE — Patient Instructions (Signed)
Medication Instructions:  - Your physician has recommended you make the following change in your medication:   1) STOP Ranexa (Ranolazine)   *If you need a refill on your cardiac medications before your next appointment, please call your pharmacy*   Lab Work: - none ordered  If you have labs (blood work) drawn today and your tests are completely normal, you will receive your results only by: [facility] (if you have MyChart) OR A paper copy in the mail If you have any lab test that is abnormal or we need to change your treatment, we will call you to review the results.   Testing/Procedures: - none ordered   Follow-Up: At [facility], you and your health needs are our priority.  As part of our continuing mission to provide you with exceptional heart care, we have created designated Provider Care Teams.  These Care Teams include your primary Cardiologist (physician) and Advanced Practice Providers (APPs -  Physician Assistants and Nurse Practitioners) who all work together to provide you with the care you need, when you need it.  We recommend signing up for the patient portal called "MyChart".  Sign up information is provided on this After Visit Summary.  MyChart is used to connect with patients for Virtual Visits (Telemedicine).  Patients are able to view lab/test results, encounter notes, upcoming appointments, etc.  Non-urgent messages can be sent to your provider as well.   To learn more about what you can do with MyChart, go to [email or web address].    Your next appointment:   10 month(s)  The format for your next appointment:   In Person  Provider:   Virl Axe, MD    Other Instructions N/a
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
## 2021-03-24 ENCOUNTER — Telehealth: Payer: Self-pay | Admitting: Cardiovascular Disease

## 2021-03-24 DIAGNOSIS — H25013 Cortical age-related cataract, bilateral: Secondary | ICD-10-CM | POA: Diagnosis not present

## 2021-03-24 DIAGNOSIS — H2512 Age-related nuclear cataract, left eye: Secondary | ICD-10-CM | POA: Diagnosis not present

## 2021-03-24 DIAGNOSIS — H2513 Age-related nuclear cataract, bilateral: Secondary | ICD-10-CM | POA: Diagnosis not present

## 2021-03-24 DIAGNOSIS — H25043 Posterior subcapsular polar age-related cataract, bilateral: Secondary | ICD-10-CM | POA: Diagnosis not present

## 2021-03-24 DIAGNOSIS — H18413 Arcus senilis, bilateral: Secondary | ICD-10-CM | POA: Diagnosis not present

## 2021-03-24 NOTE — Telephone Encounter (Signed)
Dr. [name], please review. You just saw Roger Austin 2 days ago, although we typically clear the patient for cataract surgery since it is very low risk, however your note suggest Mr. Damewood is not doing very well having more SOB and chest discomfort. There is mention that his device is near ERI and possible atrial lead dislodgement. Would you recommend delaying eye surgery for now?  Please forward your response to [facility] PREOP

## 2021-03-24 NOTE — Telephone Encounter (Signed)
° °  Pre-operative Risk Assessment    Patient Name: Roger Austin  DOB: 1955-11-06 MRN: [id number]      Request for Surgical Clearance    Procedure:   cataract extraction with intraocular lens implantation of left eye (06/01/21) followed by right eye (07/06/21)  Date of Surgery:  Clearance 06/01/21 & 07/06/21                            Surgeon:  Dr Darleen Crocker and Dr Rolanda Jay  Surgeon's Group or Practice Name:  [facility]  Phone number:  [phone] Fax number:  [phone]   Type of Clearance Requested:   - Medical    Type of Anesthesia:  Not Indicated   Additional requests/questions:    Manfred Arch   03/24/2021, 3:20 PM

## 2021-03-25 NOTE — Telephone Encounter (Signed)
° °  Patient Name: BOWE SIDOR  DOB: March 17, 1955 MRN: [id number]  Primary Cardiologist: [name], MD  Chart reviewed as part of pre-operative protocol coverage. Cataract extractions are recognized in guidelines as low risk surgeries that do not typically require specific preoperative testing or holding of blood thinner therapy. Therefore, given past medical history and time since last visit, based on ACC/AHA guidelines, DIMITRIUS STEEDMAN would be at acceptable risk for the planned procedure without further cardiovascular testing.   Spoke with Dr. [name] who felt his recent issue should not affect cataract surgery.   I will route this recommendation to the requesting party via Epic fax function and remove from pre-op pool.  Please call with questions.  [place], [place] 03/25/2021, 11:10 AM

## 2021-04-15 DIAGNOSIS — M19032 Primary osteoarthritis, left wrist: Secondary | ICD-10-CM | POA: Diagnosis not present

## 2021-04-15 DIAGNOSIS — M1812 Unilateral primary osteoarthritis of first carpometacarpal joint, left hand: Secondary | ICD-10-CM | POA: Diagnosis not present

## 2021-04-28 DIAGNOSIS — Z4789 Encounter for other orthopedic aftercare: Secondary | ICD-10-CM | POA: Diagnosis not present

## 2021-05-02 NOTE — Progress Notes (Signed)
Attempted to call patient, patient's wife, and patient's daughter without any answer.  Left voicemail on patient's phone number that appointment for tomorrow is canceled.   Per cardiology PA the patient has an abandoned lead and it is not safe to have MRI.

## 2021-05-02 NOTE — Progress Notes (Signed)
Mr Eaves called back. I reviewed the information with patient that per the cardiology PA that he has an abandoned lead and that he is unable to have MRI.  Patient was very nice and understanding.  Nothing further needed at this time.

## 2021-05-03 ENCOUNTER — Ambulatory Visit (HOSPITAL_COMMUNITY): Admission: RE | Admit: 2021-05-03 | Payer: Medicare HMO | Source: Ambulatory Visit

## 2021-05-03 ENCOUNTER — Ambulatory Visit (HOSPITAL_COMMUNITY)
Admission: RE | Admit: 2021-05-03 | Discharge: 2021-05-03 | Disposition: A | Discharge status: Home / Self Care | Payer: Medicare HMO | Source: Ambulatory Visit | Attending: Neurology | Admitting: Neurology

## 2021-05-09 DIAGNOSIS — D2272 Melanocytic nevi of left lower limb, including hip: Secondary | ICD-10-CM | POA: Diagnosis not present

## 2021-05-09 DIAGNOSIS — L570 Actinic keratosis: Secondary | ICD-10-CM | POA: Diagnosis not present

## 2021-05-09 DIAGNOSIS — L728 Other follicular cysts of the skin and subcutaneous tissue: Secondary | ICD-10-CM | POA: Diagnosis not present

## 2021-05-09 DIAGNOSIS — B078 Other viral warts: Secondary | ICD-10-CM | POA: Diagnosis not present

## 2021-05-09 DIAGNOSIS — L400 Psoriasis vulgaris: Secondary | ICD-10-CM | POA: Diagnosis not present

## 2021-05-09 DIAGNOSIS — Z85828 Personal history of other malignant neoplasm of skin: Secondary | ICD-10-CM | POA: Diagnosis not present

## 2021-05-09 DIAGNOSIS — L538 Other specified erythematous conditions: Secondary | ICD-10-CM | POA: Diagnosis not present

## 2021-05-09 DIAGNOSIS — X32XXXA Exposure to sunlight, initial encounter: Secondary | ICD-10-CM | POA: Diagnosis not present

## 2021-05-09 DIAGNOSIS — D2261 Melanocytic nevi of right upper limb, including shoulder: Secondary | ICD-10-CM | POA: Diagnosis not present

## 2021-05-12 DIAGNOSIS — M25642 Stiffness of left hand, not elsewhere classified: Secondary | ICD-10-CM | POA: Diagnosis not present

## 2021-05-12 DIAGNOSIS — Z4789 Encounter for other orthopedic aftercare: Secondary | ICD-10-CM | POA: Diagnosis not present

## 2021-05-16 IMAGING — CT CT ANGIO CHEST
2 of 6 series · 18 of 46 positions shown · IV contrast (APPLIED)
Comparison: 11/03/2019

CLINICAL DATA: Worsening dyspnea

EXAM:
CT ANGIOGRAPHY CHEST WITH CONTRAST
TECHNIQUE: Multidetector CT imaging of the chest was performed using the
standard protocol during bolus administration of intravenous
contrast. Multiplanar CT image reconstructions and MIPs were
obtained to evaluate the vascular anatomy.
CONTRAST:  100mL OMNIPAQUE IOHEXOL 350 MG/ML SOLN

[Series 5: thins · axial · 0.80mm/px · z∈[+53,+291]mm · 15 of 262 slices shown]
[im 12/262  lung]
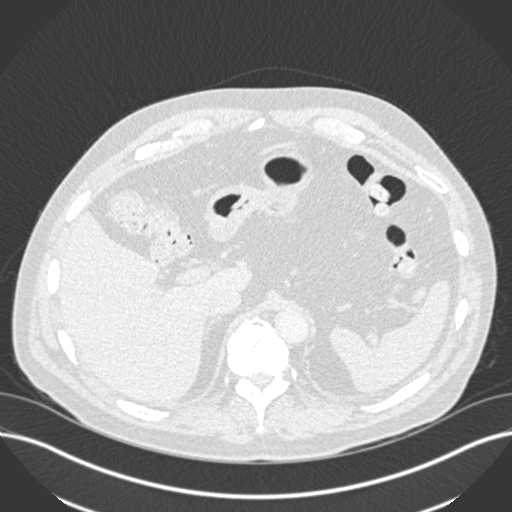
[im 35/262  soft-tissue]
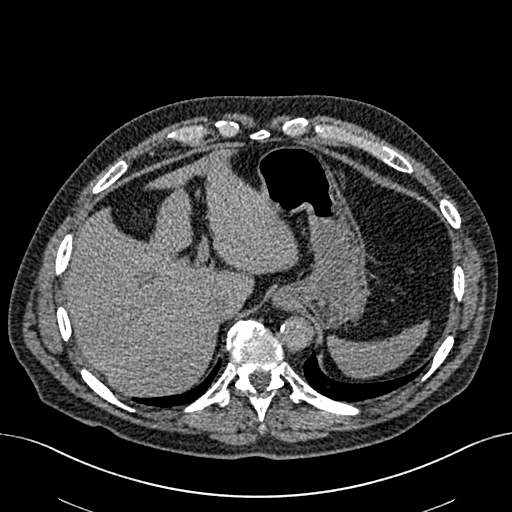
[im 46/262  lung]
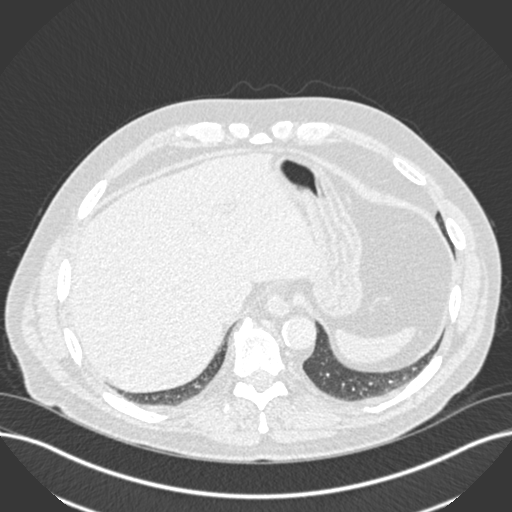
[im 69/262  soft-tissue]
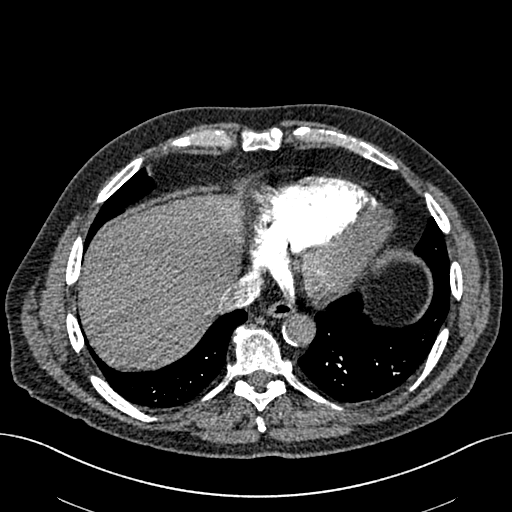
[im 80/262  lung]
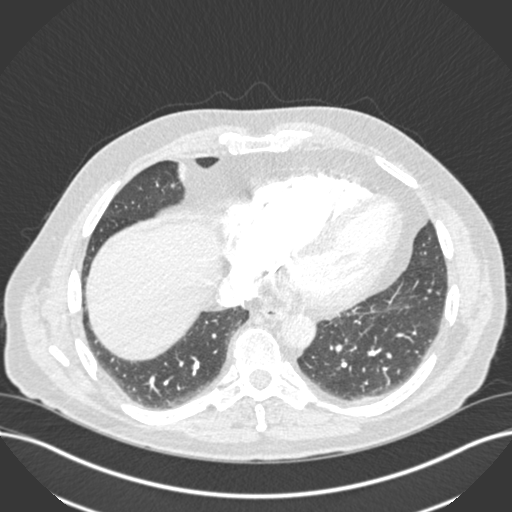
[im 103/262  soft-tissue]
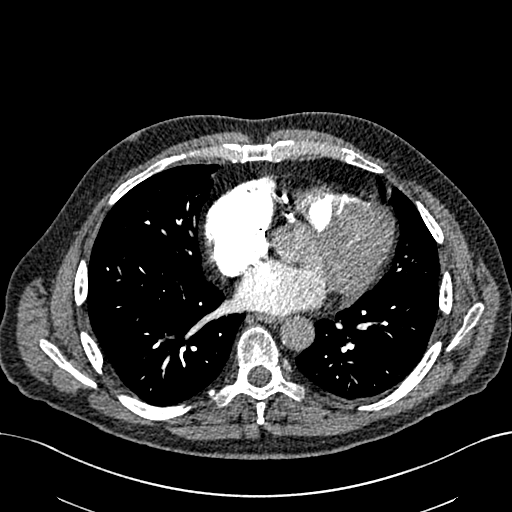
[im 114/262  lung]
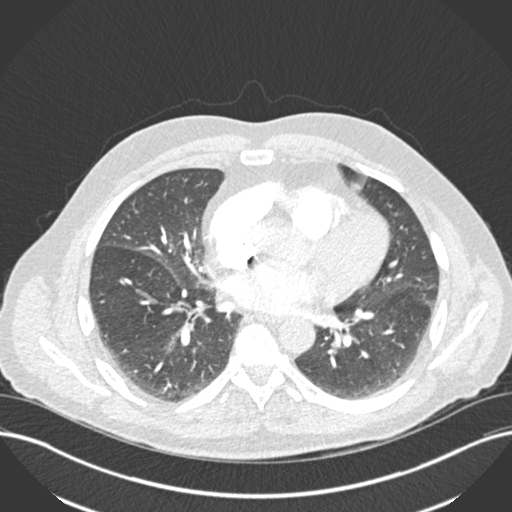
[im 137/262  soft-tissue]
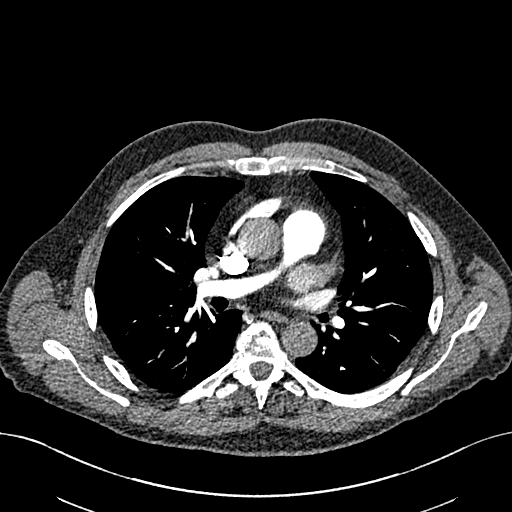
[im 148/262  lung]
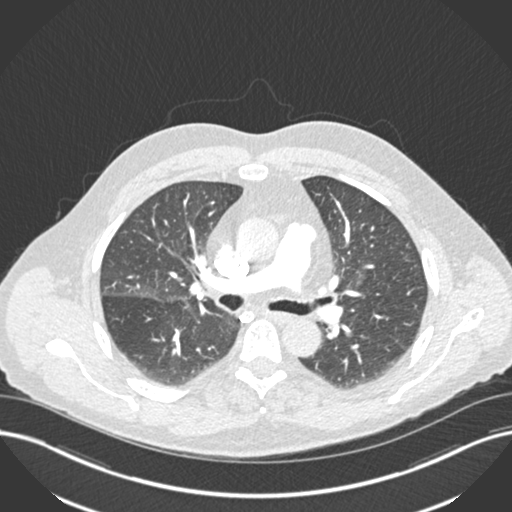
[im 159/262  soft-tissue]
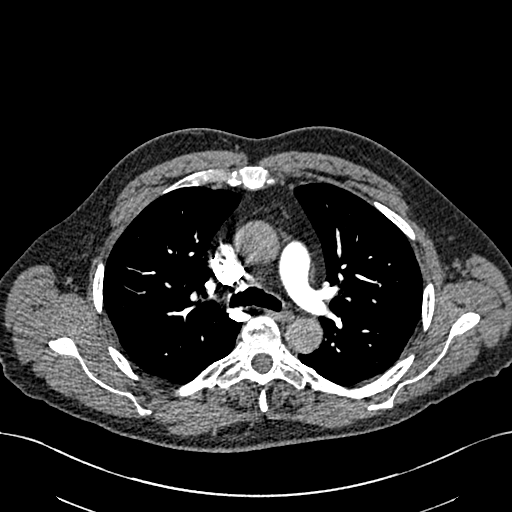
[im 182/262  lung]
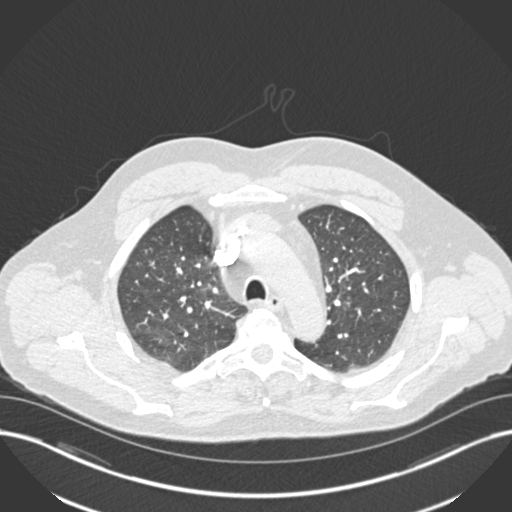
[im 193/262  soft-tissue]
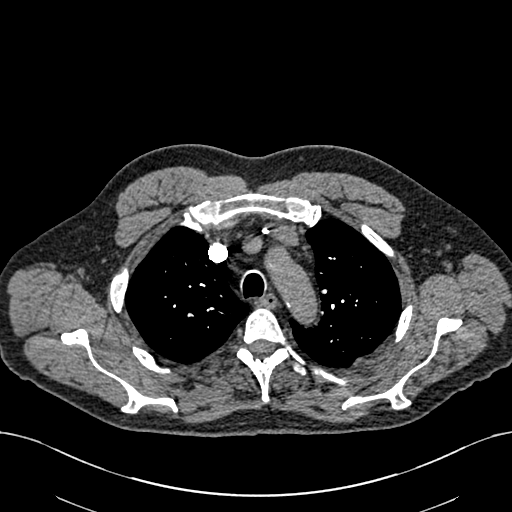
[im 216/262  lung]
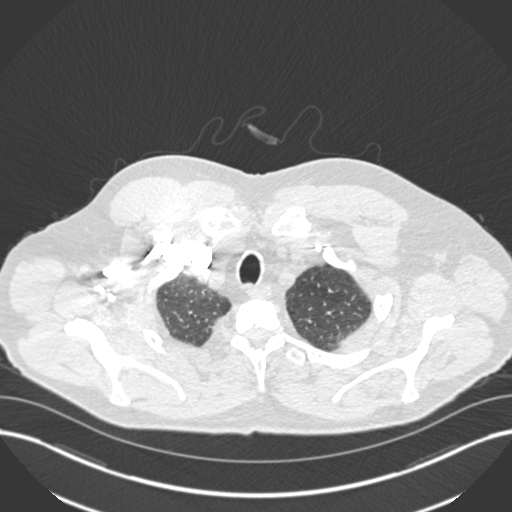
[im 227/262  soft-tissue]
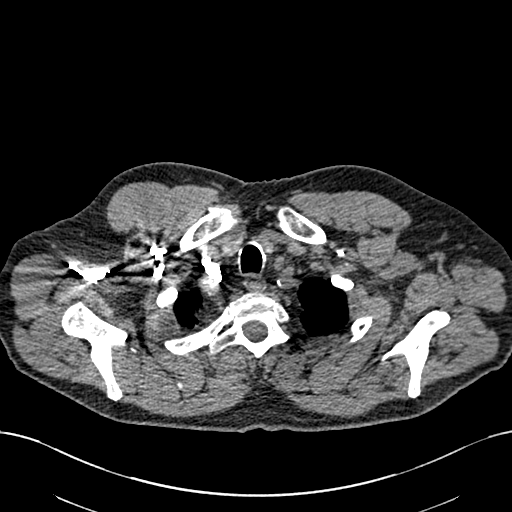
[im 250/262  lung]
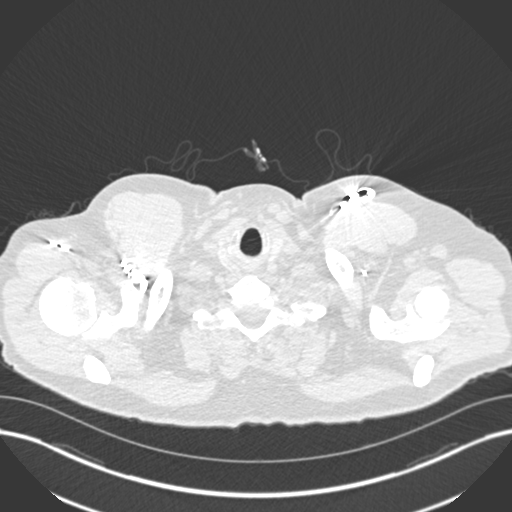

[Series 7: coronal mpr · coronal · 0.55mm/px · 3 of 100 slices shown]
[im 25/100  soft-tissue]
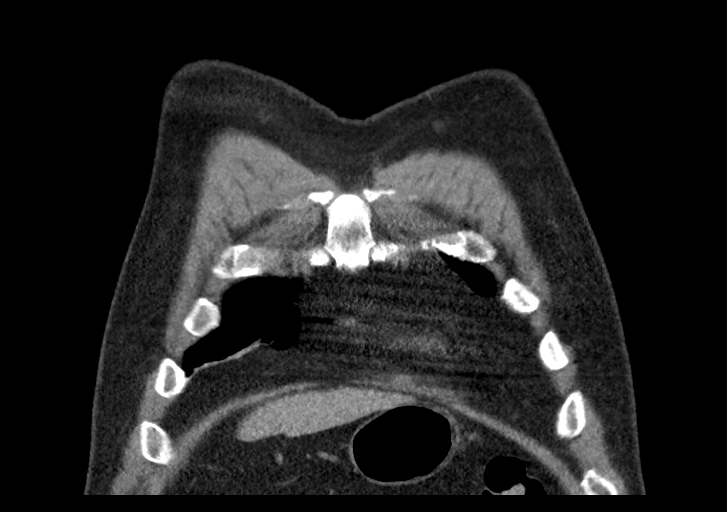
[im 50/100  soft-tissue]
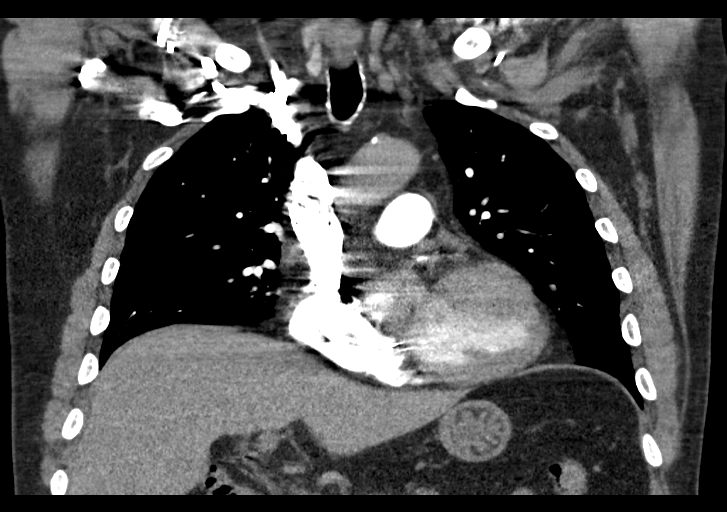
[im 75/100  soft-tissue]
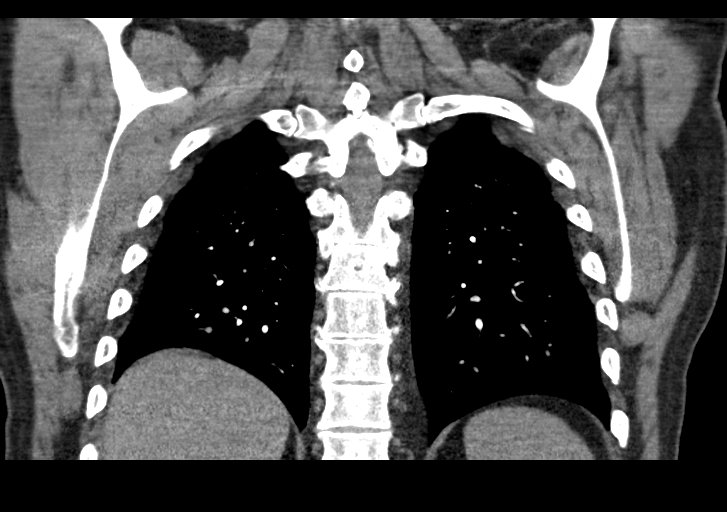

[18 of 46 positions shown; findings below may reference images not displayed]

FINDINGS: Cardiovascular: Satisfactory opacification of the pulmonary arteries
to the segmental level. No evidence of pulmonary embolism. Normal
heart size. Coronary artery calcification. Thoracic aorta is normal
in caliber with mild calcification. Left chest wall single lead
pacemaker. No pericardial effusion.

Mediastinum/Nodes: No enlarged lymph nodes. Thyroid and esophagus
are unremarkable.

Lungs/Pleura: No consolidation or mass. No pleural effusion or
pneumothorax.

Upper Abdomen: No acute abnormality.

Musculoskeletal: Degenerative changes of the included spine. No
acute osseous abnormality.

Review of the MIP images confirms the above findings.
IMPRESSION: No evidence of acute pulmonary embolism or other acute abnormality.

## 2021-05-23 DIAGNOSIS — M79642 Pain in left hand: Secondary | ICD-10-CM | POA: Diagnosis not present

## 2021-06-01 DIAGNOSIS — H2512 Age-related nuclear cataract, left eye: Secondary | ICD-10-CM | POA: Diagnosis not present

## 2021-06-02 DIAGNOSIS — H2511 Age-related nuclear cataract, right eye: Secondary | ICD-10-CM | POA: Diagnosis not present

## 2021-06-02 DIAGNOSIS — M25642 Stiffness of left hand, not elsewhere classified: Secondary | ICD-10-CM | POA: Diagnosis not present

## 2021-06-06 ENCOUNTER — Ambulatory Visit (INDEPENDENT_AMBULATORY_CARE_PROVIDER_SITE_OTHER): Payer: Medicare HMO | Admitting: Neurology

## 2021-06-06 ENCOUNTER — Encounter: Payer: Self-pay | Admitting: Neurology

## 2021-06-06 VITALS — BP 147/95 | HR 65 | Ht 72.0 in | Wt 202.0 lb

## 2021-06-06 DIAGNOSIS — M791 Myalgia, unspecified site: Secondary | ICD-10-CM | POA: Diagnosis not present

## 2021-06-06 DIAGNOSIS — R5382 Chronic fatigue, unspecified: Secondary | ICD-10-CM
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
## 2021-06-07 DIAGNOSIS — M25642 Stiffness of left hand, not elsewhere classified: Secondary | ICD-10-CM | POA: Diagnosis not present

## 2021-08-10 ENCOUNTER — Encounter: Payer: Self-pay | Admitting: Internal Medicine

## 2021-09-27 ENCOUNTER — Ambulatory Visit (INDEPENDENT_AMBULATORY_CARE_PROVIDER_SITE_OTHER): Payer: Medicare HMO

## 2021-09-27 DIAGNOSIS — I495 Sick sinus syndrome: Secondary | ICD-10-CM

## 2021-09-27 LAB — CUP PACEART REMOTE DEVICE CHECK
Battery Remaining Longevity: 155 mo
Battery Voltage: 3.12 V
Brady Statistic AP VP Percent: 2.25 %
Brady Statistic AP VS Percent: 89.59 %
Brady Statistic AS VP Percent: 0.26 %
Brady Statistic AS VS Percent: 7.9 %
Brady Statistic RA Percent Paced: 95.5 %
Brady Statistic RV Percent Paced: 2.51 %
Date Time Interrogation Session: 20230718125854
Implantable Lead Implant Date: 20221104
Implantable Lead Implant Date: 20221104
Implantable Lead Location: 753859
Implantable Lead Location: 753860
Implantable Lead Model: 3830
Implantable Lead Model: 5076
Implantable Pulse Generator Implant Date: 20221104
Lead Channel Impedance Value: 285 Ohm
Lead Channel Impedance Value: 418 Ohm
Lead Channel Impedance Value: 456 Ohm
Lead Channel Impedance Value: 589 Ohm
Lead Channel Pacing Threshold Amplitude: 0.625 V
Lead Channel Pacing Threshold Amplitude: 0.625 V
Lead Channel Pacing Threshold Pulse Width: 0.4 ms
Lead Channel Pacing Threshold Pulse Width: 0.4 ms
Lead Channel Sensing Intrinsic Amplitude: 30.75 mV
Lead Channel Sensing Intrinsic Amplitude: 30.75 mV
Lead Channel Sensing Intrinsic Amplitude: 4.125 mV
Lead Channel Sensing Intrinsic Amplitude: 4.125 mV
Lead Channel Setting Pacing Amplitude: 1.5 V
Lead Channel Setting Pacing Amplitude: 2 V
Lead Channel Setting Pacing Pulse Width: 0.4 ms
Lead Channel Setting Sensing Sensitivity: 0.9 mV

## 2021-10-25 NOTE — Progress Notes (Signed)
Remote pacemaker transmission.   

## 2021-12-27 ENCOUNTER — Ambulatory Visit (INDEPENDENT_AMBULATORY_CARE_PROVIDER_SITE_OTHER): Payer: Medicare HMO

## 2021-12-27 DIAGNOSIS — I495 Sick sinus syndrome: Secondary | ICD-10-CM | POA: Diagnosis not present

## 2021-12-27 LAB — CUP PACEART REMOTE DEVICE CHECK
Battery Remaining Longevity: 151 mo
Battery Voltage: 3.06 V
Brady Statistic AP VP Percent: 0.46 %
Brady Statistic AP VS Percent: 98.54 %
Brady Statistic AS VP Percent: 0.01 %
Brady Statistic AS VS Percent: 0.98 %
Brady Statistic RA Percent Paced: 99.51 %
Brady Statistic RV Percent Paced: 0.47 %
Date Time Interrogation Session: 20231017023526
Implantable Lead Implant Date: 20221104
Implantable Lead Implant Date: 20221104
Implantable Lead Location: 753859
Implantable Lead Location: 753860
Implantable Lead Model: 3830
Implantable Lead Model: 5076
Implantable Pulse Generator Implant Date: 20221104
Lead Channel Impedance Value: 285 Ohm
Lead Channel Impedance Value: 399 Ohm
Lead Channel Impedance Value: 437 Ohm
Lead Channel Impedance Value: 589 Ohm
Lead Channel Pacing Threshold Amplitude: 0.5 V
Lead Channel Pacing Threshold Amplitude: 0.625 V
Lead Channel Pacing Threshold Pulse Width: 0.4 ms
Lead Channel Pacing Threshold Pulse Width: 0.4 ms
Lead Channel Sensing Intrinsic Amplitude: 3 mV
Lead Channel Sensing Intrinsic Amplitude: 3 mV
Lead Channel Sensing Intrinsic Amplitude: 30 mV
Lead Channel Sensing Intrinsic Amplitude: 30 mV
Lead Channel Setting Pacing Amplitude: 1.5 V
Lead Channel Setting Pacing Amplitude: 2 V
Lead Channel Setting Pacing Pulse Width: 0.4 ms
Lead Channel Setting Sensing Sensitivity: 0.9 mV

## 2022-01-11 NOTE — Progress Notes (Signed)
Remote pacemaker transmission.   

## 2022-03-28 ENCOUNTER — Ambulatory Visit: Payer: PRIVATE HEALTH INSURANCE | Attending: Internal Medicine

## 2022-03-28 DIAGNOSIS — I495 Sick sinus syndrome: Secondary | ICD-10-CM | POA: Diagnosis not present

## 2022-03-28 LAB — CUP PACEART REMOTE DEVICE CHECK
Battery Remaining Longevity: 149 mo
Battery Voltage: 3.04 V
Brady Statistic AP VP Percent: 0.55 %
Brady Statistic AP VS Percent: 98.65 %
Brady Statistic AS VP Percent: 0.01 %
Brady Statistic AS VS Percent: 0.78 %
Brady Statistic RA Percent Paced: 99.71 %
Brady Statistic RV Percent Paced: 0.57 %
Date Time Interrogation Session: 20240115231436
Implantable Lead Connection Status: 753985
Implantable Lead Connection Status: 753985
Implantable Lead Implant Date: 20221104
Implantable Lead Implant Date: 20221104
Implantable Lead Location: 753859
Implantable Lead Location: 753860
Implantable Lead Model: 3830
Implantable Lead Model: 5076
Implantable Pulse Generator Implant Date: 20221104
Lead Channel Impedance Value: 323 Ohm
Lead Channel Impedance Value: 437 Ohm
Lead Channel Impedance Value: 456 Ohm
Lead Channel Impedance Value: 589 Ohm
Lead Channel Pacing Threshold Amplitude: 0.5 V
Lead Channel Pacing Threshold Amplitude: 0.5 V
Lead Channel Pacing Threshold Pulse Width: 0.4 ms
Lead Channel Pacing Threshold Pulse Width: 0.4 ms
Lead Channel Sensing Intrinsic Amplitude: 0.75 mV
Lead Channel Sensing Intrinsic Amplitude: 0.75 mV
Lead Channel Sensing Intrinsic Amplitude: 31.375 mV
Lead Channel Sensing Intrinsic Amplitude: 31.375 mV
Lead Channel Setting Pacing Amplitude: 1.5 V
Lead Channel Setting Pacing Amplitude: 2 V
Lead Channel Setting Pacing Pulse Width: 0.4 ms
Lead Channel Setting Sensing Sensitivity: 0.9 mV
Zone Setting Status: 755011
Zone Setting Status: 755011

## 2022-04-13 IMAGING — CR DG CHEST 2V
2 series · 2 of 2 positions shown · non-contrast
Comparison: March 22, 2014

CLINICAL DATA: Cardiac pacing device

EXAM:
CHEST - 2 VIEW

[w chest pa]
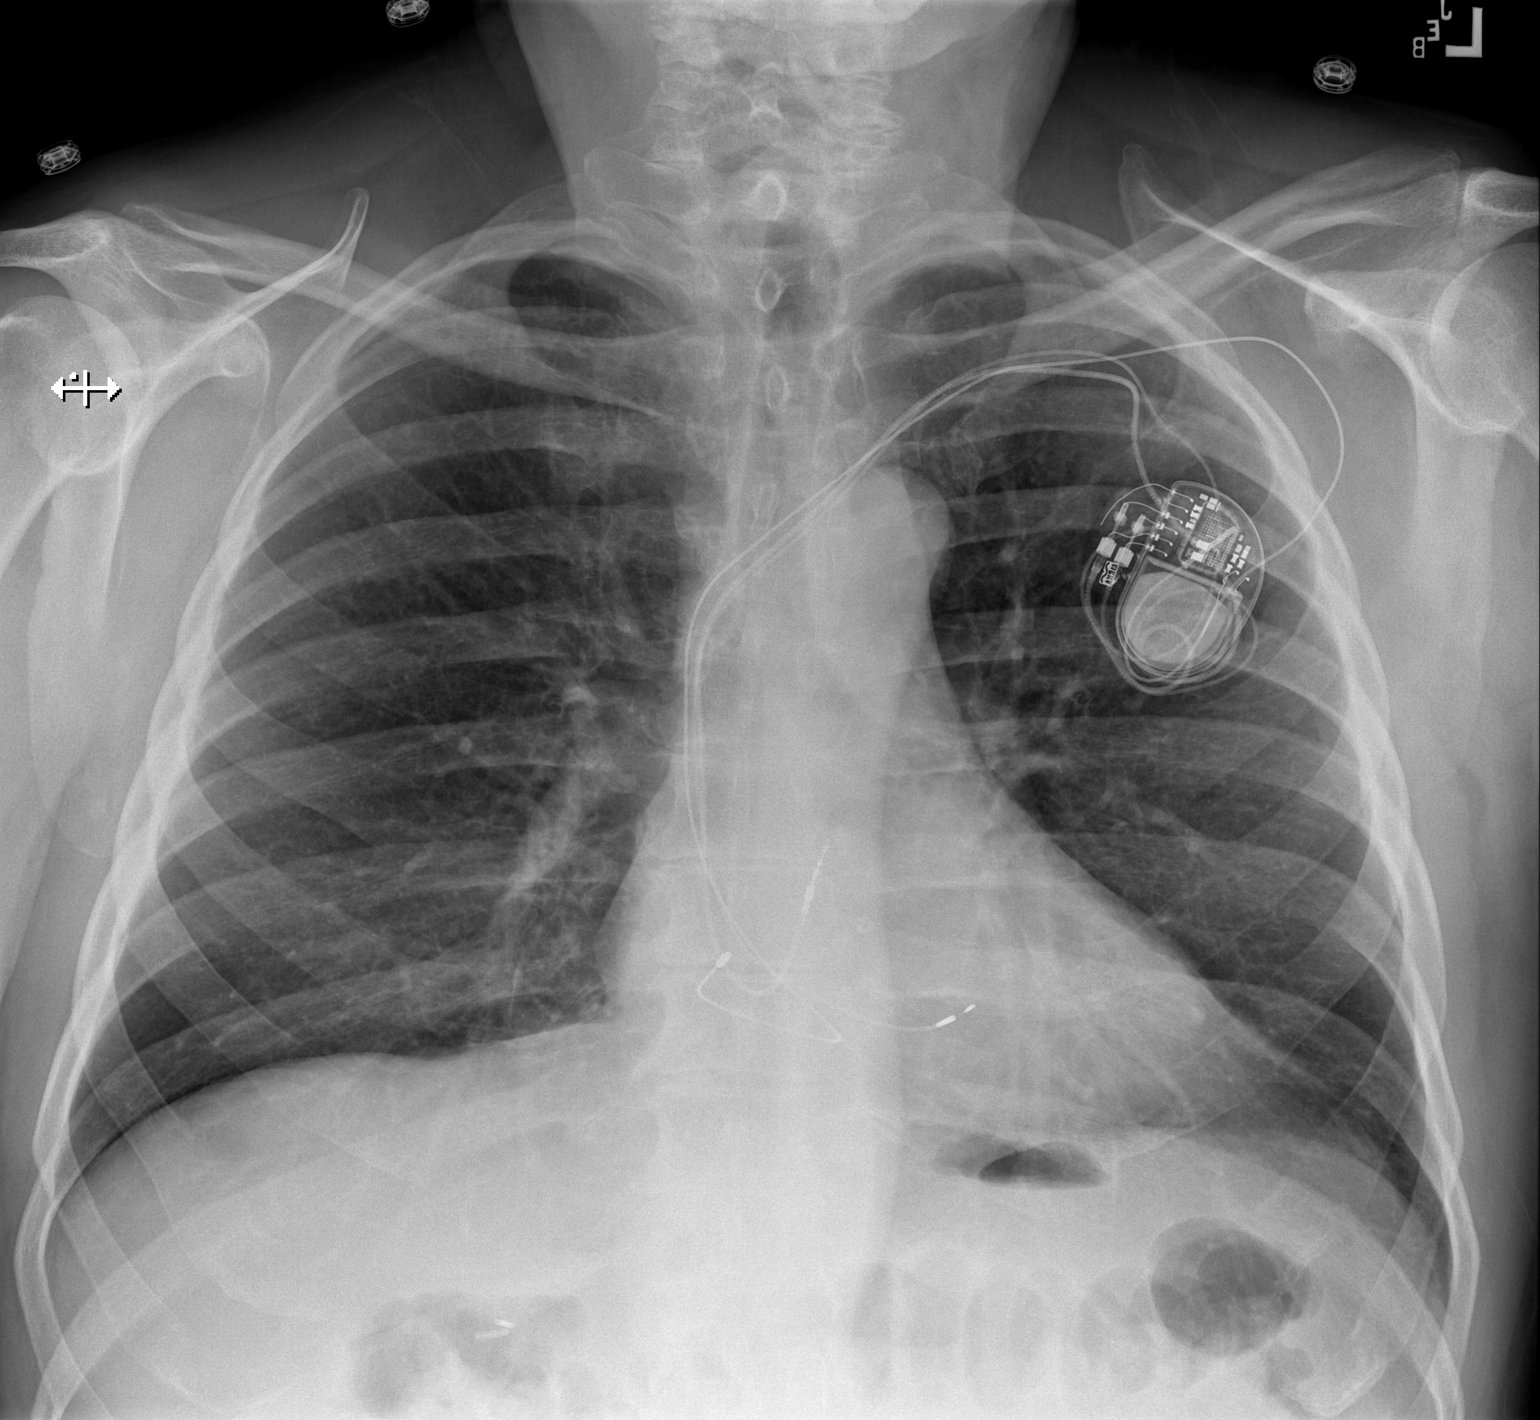

[w chest lat]
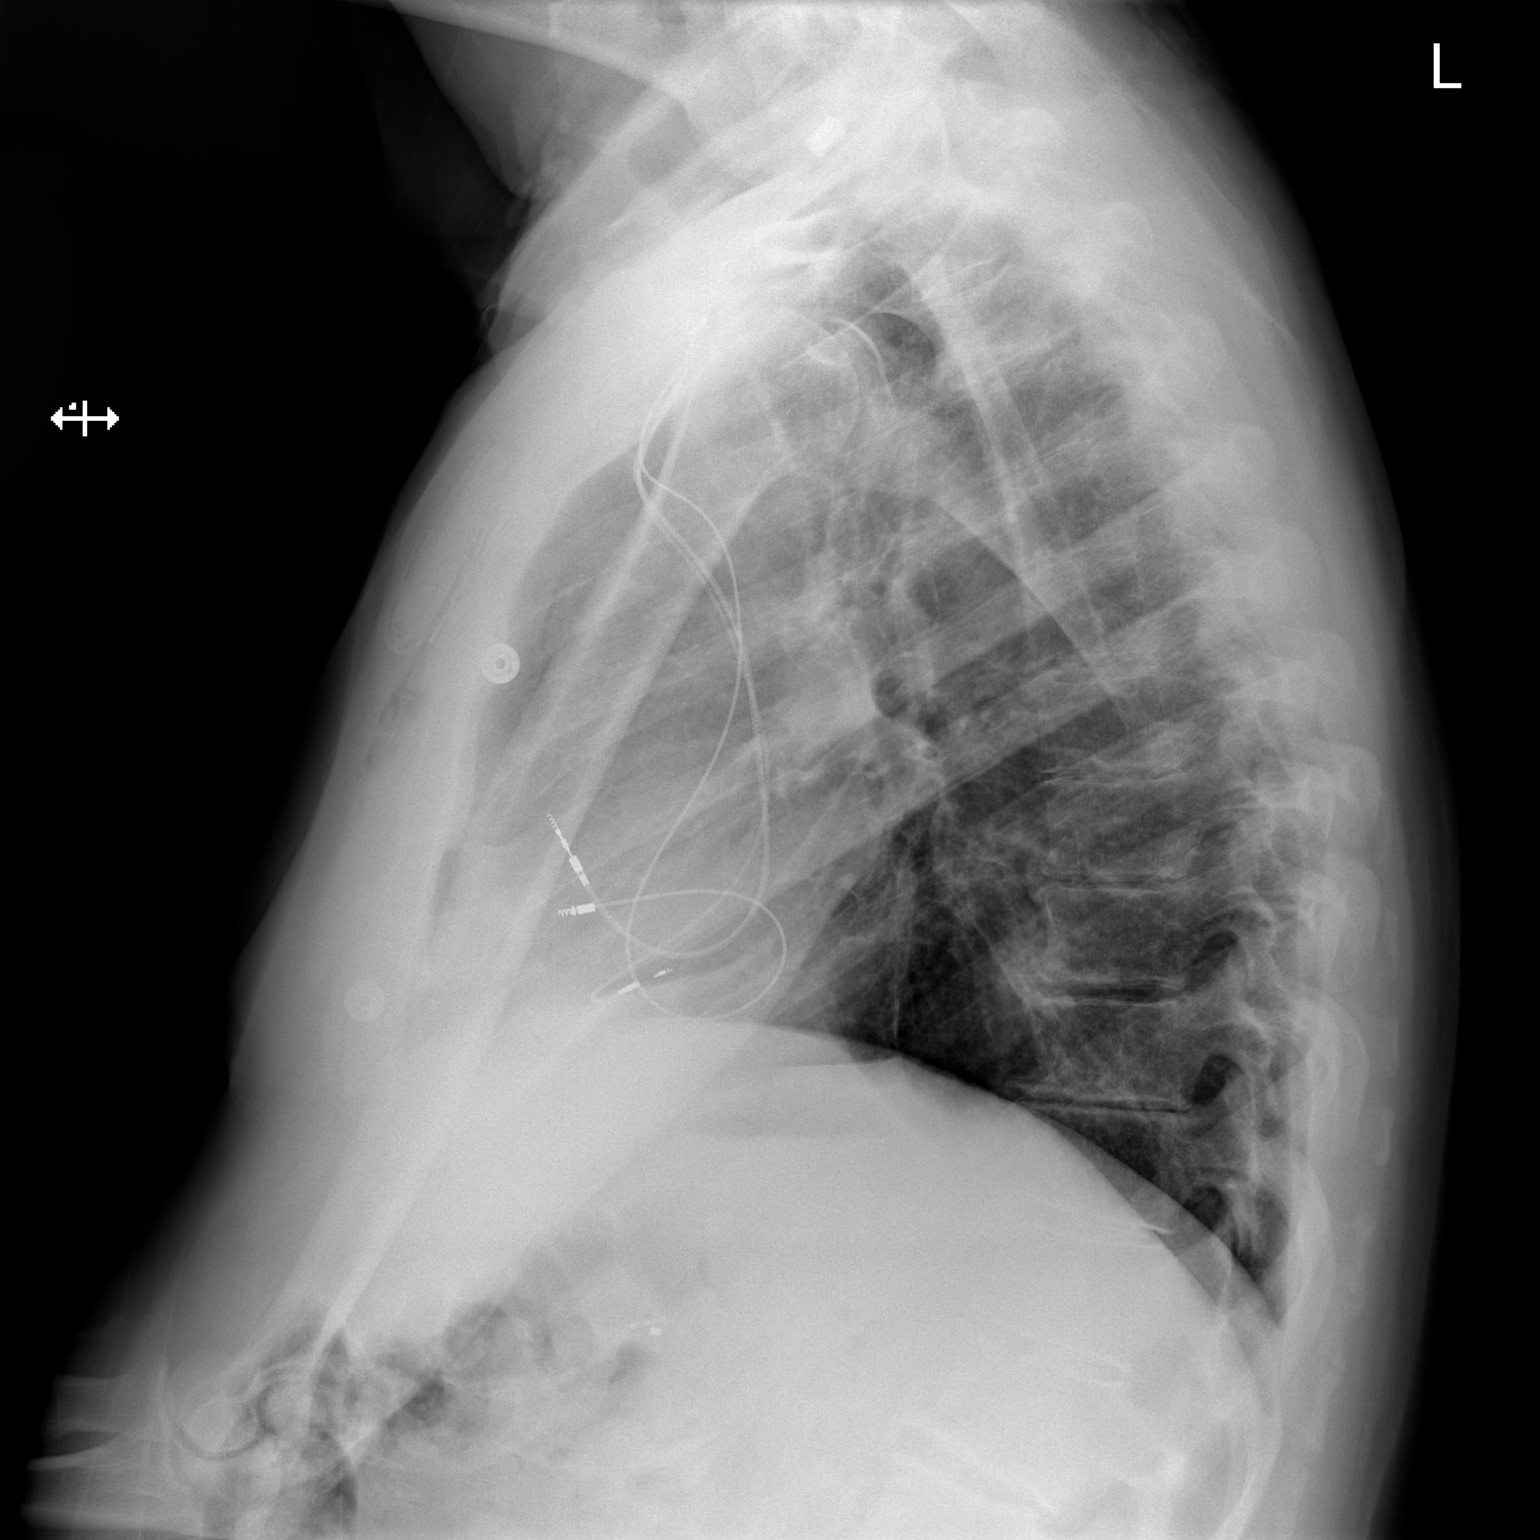

[2 of 2 positions shown; findings below may reference images not displayed]

FINDINGS: The cardiomediastinal silhouette is unchanged in contour.Interval
revision of LEFT chest cardiac pacing device with 3 leads in the
RIGHT atrium and coronary sinus. No pleural effusion. No
pneumothorax. No acute pleuroparenchymal abnormality. Surgical clips
project over the upper abdomen. Mild degenerative changes of the
thoracic spine.
IMPRESSION: Revision of LEFT chest cardiac pacing device without evidence of
complication.

## 2022-04-20 ENCOUNTER — Other Ambulatory Visit: Payer: Self-pay | Admitting: Orthopedic Surgery

## 2022-04-20 DIAGNOSIS — G8929 Other chronic pain: Secondary | ICD-10-CM

## 2022-04-21 NOTE — Progress Notes (Signed)
Remote pacemaker transmission.   

## 2022-04-26 ENCOUNTER — Inpatient Hospital Stay: Admission: RE | Admit: 2022-04-26 | Payer: PRIVATE HEALTH INSURANCE | Source: Ambulatory Visit

## 2022-04-26 ENCOUNTER — Encounter: Payer: Self-pay | Admitting: Orthopedic Surgery

## 2022-04-27 ENCOUNTER — Other Ambulatory Visit: Payer: Self-pay | Admitting: Orthopedic Surgery

## 2022-04-27 DIAGNOSIS — G8929 Other chronic pain: Secondary | ICD-10-CM

## 2022-05-15 ENCOUNTER — Ambulatory Visit
Admission: RE | Admit: 2022-05-15 | Discharge: 2022-05-15 | Disposition: A | Discharge status: Home / Self Care | Payer: Medicare HMO | Source: Ambulatory Visit | Attending: Orthopedic Surgery | Admitting: Orthopedic Surgery

## 2022-05-15 ENCOUNTER — Ambulatory Visit
Admission: RE | Admit: 2022-05-15 | Discharge: 2022-05-15 | Disposition: A | Discharge status: Home / Self Care | Payer: Medicare HMO | Source: Ambulatory Visit | Attending: Orthopedic Surgery

## 2022-05-15 DIAGNOSIS — M75121 Complete rotator cuff tear or rupture of right shoulder, not specified as traumatic: Secondary | ICD-10-CM | POA: Insufficient documentation

## 2022-05-15 DIAGNOSIS — M25511 Pain in right shoulder: Secondary | ICD-10-CM | POA: Diagnosis present

## 2022-05-15 DIAGNOSIS — G8929 Other chronic pain: Secondary | ICD-10-CM

## 2022-05-15 MED ORDER — SODIUM CHLORIDE (PF) 0.9 % IJ SOLN
20.0000 mL | Freq: Once | INTRAMUSCULAR | Status: AC
  Administered 2022-05-15: 20 mL

## 2022-05-15 MED ORDER — LIDOCAINE HCL (PF) 1 % IJ SOLN
10.0000 mL | Freq: Once | INTRAMUSCULAR | Status: AC
  Administered 2022-05-15: 8 mL

## 2022-05-15 MED ORDER — IOHEXOL 180 MG/ML  SOLN
10.0000 mL | Freq: Once | INTRAMUSCULAR | Status: AC | PRN
  Administered 2022-05-15: 5 mL

## 2022-05-19 ENCOUNTER — Telehealth: Payer: Self-pay | Admitting: Internal Medicine

## 2022-05-19 NOTE — Telephone Encounter (Signed)
Primary Cardiologist:Muhammad Fletcher Anon, MD  Chart reviewed as part of pre-operative protocol coverage. Because of Roger Austin past medical history and time since last visit, he/she will require a follow-up visit in order to better assess preoperative cardiovascular risk.  Pre-op covering staff: - Please schedule appointment and call patient to inform them. - Please contact requesting surgeon's office via preferred method (i.e, phone, fax) to inform them of need for appointment prior to surgery.  If applicable, this message will also be routed to pharmacy pool and/or primary cardiologist for input on holding anticoagulant/antiplatelet agent as requested below so that this information is available at time of patient's appointment.   Emmaline Life, NP-C  05/19/2022, 4:56 PM [address], [address] Office [phone] Fax [phone]

## 2022-05-19 NOTE — Telephone Encounter (Signed)
I looked for an appt for the pt in the [facility] for pre op clearance. I will see if [place] office will be able to help find an appt for pre op clearance.

## 2022-05-19 NOTE — Telephone Encounter (Signed)
   Pre-operative Risk Assessment    Patient Name: Roger Austin  DOB: 02-15-1956 MRN: [id number]      Request for Surgical Clearance    Procedure:  Right shoulder arthroscopic vs mini-open rotator cuff repair with possible allograft patch augmentation, subacromial decompression, distal clavicle excisison and biceps tenodesis  Date of Surgery:  Clearance 06/27/22                                 Surgeon:  Dr. Leim Fabry Surgeon's Group or Practice Name: [facility]  Phone number:  [phone] Fax number:  [phone]   Type of Clearance Requested:   - Medical    Type of Anesthesia:  Not Indicated   Additional requests/questions:    Signed, Maxwell Caul   05/19/2022, 4:41 PM

## 2022-05-19 NOTE — Telephone Encounter (Signed)
Error

## 2022-05-22 NOTE — Telephone Encounter (Signed)
The patient has an appt scheduled for 06/20/22 with dr [name]; reviewed the schedule to see if there was a sooner appt., but no availability.   Patient notified and voiced understanding.

## 2022-05-30 ENCOUNTER — Other Ambulatory Visit: Payer: Self-pay | Admitting: Orthopedic Surgery

## 2022-06-15 ENCOUNTER — Encounter
Admission: RE | Admit: 2022-06-15 | Discharge: 2022-06-15 | Disposition: A | Discharge status: Home / Self Care | Payer: Medicare HMO | Source: Ambulatory Visit | Attending: Orthopedic Surgery | Admitting: Orthopedic Surgery

## 2022-06-15 ENCOUNTER — Other Ambulatory Visit: Payer: Self-pay

## 2022-06-15 VITALS — Ht 72.0 in | Wt 200.0 lb

## 2022-06-15 DIAGNOSIS — R9439 Abnormal result of other cardiovascular function study: Secondary | ICD-10-CM

## 2022-06-15 DIAGNOSIS — I2089 Other forms of angina pectoris: Secondary | ICD-10-CM

## 2022-06-15 DIAGNOSIS — I10 Essential (primary) hypertension: Secondary | ICD-10-CM

## 2022-06-15 DIAGNOSIS — Z950 Presence of cardiac pacemaker: Secondary | ICD-10-CM

## 2022-06-15 DIAGNOSIS — E782 Mixed hyperlipidemia: Secondary | ICD-10-CM

## 2022-06-15 DIAGNOSIS — I25118 Atherosclerotic heart disease of native coronary artery with other forms of angina pectoris: Secondary | ICD-10-CM

## 2022-06-15 NOTE — Patient Instructions (Addendum)
Your procedure is scheduled on: Tuesday 06/27/22 To find out your arrival time, please call [phone] between 1PM - 3PM on:   Monday 06/26/22 Report to the Registration Desk on the 1st floor of the [facility]. Valet parking is available.  If your arrival time is 6:00 am, do not arrive before that time as the [facility] entrance doors do not open until 6:00 am.  REMEMBER: Instructions that are not followed completely may result in serious medical risk, up to and including death; or upon the discretion of your surgeon and anesthesiologist your surgery may need to be rescheduled.  Do not eat food after midnight the night before surgery.  No gum chewing or hard candies.  You may however, drink CLEAR liquids up to 2 hours before you are scheduled to arrive for your surgery. Do not drink anything within 2 hours of your scheduled arrival time.  Clear liquids include: - water  - apple juice without pulp - gatorade (not RED colors) - black coffee or tea (Do NOT add milk or creamers to the coffee or tea) Do NOT drink anything that is not on this list.  Type 1 and Type 2 diabetics should only drink water.  In addition, your doctor has ordered for you to drink the provided:  Ensure Pre-Surgery Clear Carbohydrate Drink  Drinking this carbohydrate drink up to two hours before surgery helps to reduce insulin resistance and improve patient outcomes. Please complete drinking 2 hours before scheduled arrival time.  One week prior to surgery: Stop Anti-inflammatories (NSAIDS) such as Advil, Aleve, Ibuprofen, Motrin, Naproxen, Naprosyn and Aspirin based products such as Excedrin, Goody's Powder, BC Powder. You may however, continue to take Tylenol if needed for pain up until the day of surgery.  Stop ANY OVER THE COUNTER supplements until after surgery.  Continue taking all prescribed medications.   TAKE ONLY THESE MEDICATIONS THE MORNING OF SURGERY WITH A SIP OF WATER:  allopurinol  (ZYLOPRIM) 100 MG tablet  amLODipine (NORVASC) 2.5 MG tablet  gabapentin (NEURONTIN) 300 MG capsule  famotidine (PEPCID) 40 MG tablet  Antacid (take one the night before and one on the morning of surgery - helps to prevent nausea after surgery.)  Use inhalers on the day of surgery and bring to the hospital.  No Smoking including e-cigarettes for 24 hours before surgery.  No chewable tobacco products for at least 6 hours before surgery.  No nicotine patches on the day of surgery.  Do not use any "recreational" drugs for at least a week (preferably 2 weeks) before your surgery.  Please be advised that the combination of cocaine and anesthesia may have negative outcomes, up to and including death. If you test positive for cocaine, your surgery will be cancelled.  On the morning of surgery brush your teeth with toothpaste and water, you may rinse your mouth with mouthwash if you wish. Do not swallow any toothpaste or mouthwash.  Use CHG Soap or wipes as directed on instruction sheet.  Do not wear lotions, powders, or perfumes.   Do not shave body hair from the neck down 48 hours before surgery.  Wear comfortable clothing (specific to your surgery type) to the hospital.  Do not wear jewelry, make-up, hairpins, clips or nail polish.  Contact lenses, hearing aids and dentures may not be worn into surgery.  Do not bring valuables to the hospital. [facility] is not responsible for any missing/lost belongings or valuables.   Notify your doctor if there is any change  in your medical condition (cold, fever, infection).  If you are being discharged the day of surgery, you will not be allowed to drive home. You will need a responsible individual to drive you home and stay with you for 24 hours after surgery.   If you are taking public transportation, you will need to have a responsible individual with you.  If you are being admitted to the hospital overnight, leave your suitcase in the  car. After surgery it may be brought to your room.  In case of increased patient census, it may be necessary for you, the patient, to continue your postoperative care in the Same Day Surgery department.  After surgery, you can help prevent lung complications by doing breathing exercises.  Take deep breaths and cough every 1-2 hours. Your doctor may order a device called an Incentive Spirometer to help you take deep breaths. When coughing or sneezing, hold a pillow firmly against your incision with both hands. This is called "splinting." Doing this helps protect your incision. It also decreases belly discomfort.  Surgery Visitation Policy:  Patients undergoing a surgery or procedure may have two family members or support persons with them as long as the person is not COVID-19 positive or experiencing its symptoms.   Inpatient Visitation:    Visiting hours are 7 a.m. to 8 p.m. Up to four visitors are allowed at one time in a patient room. The visitors may rotate out with other people during the day. One designated support person (adult) may remain overnight.  Please call the [facility] Dept. at [phone] if you have any questions about these instructions.     Preparing for Surgery with CHLORHEXIDINE GLUCONATE (CHG) Soap  Chlorhexidine Gluconate (CHG) Soap  o An antiseptic cleaner that kills germs and bonds with the skin to continue killing germs even after washing  o Used for showering the night before surgery and morning of surgery  Before surgery, you can play an important role by reducing the number of germs on your skin.  CHG (Chlorhexidine gluconate) soap is an antiseptic cleanser which kills germs and bonds with the skin to continue killing germs even after washing.  Please do not use if you have an allergy to CHG or antibacterial soaps. If your skin becomes reddened/irritated stop using the CHG.  1. Shower the NIGHT BEFORE SURGERY and the MORNING OF SURGERY  with CHG soap.  2. If you choose to wash your hair, wash your hair first as usual with your normal shampoo.  3. After shampooing, rinse your hair and body thoroughly to remove the shampoo.  4. Use CHG as you would any other liquid soap. You can apply CHG directly to the skin and wash gently with a scrungie or a clean washcloth.  5. Apply the CHG soap to your body only from the neck down. Do not use on open wounds or open sores. Avoid contact with your eyes, ears, mouth, and genitals (private parts). Wash face and genitals (private parts) with your normal soap.  6. Wash thoroughly, paying special attention to the area where your surgery will be performed.  7. Thoroughly rinse your body with warm water.  8. Do not shower/wash with your normal soap after using and rinsing off the CHG soap.  9. Pat yourself dry with a clean towel.  10. Wear clean pajamas to bed the night before surgery.  12. Place clean sheets on your bed the night of your first shower and do not sleep with pets.  58. Shower again with the CHG soap on the day of surgery prior to arriving at the hospital.  14. Do not apply any deodorants/lotions/powders.  15. Please wear clean clothes to the hospital.  How to Use an Incentive Spirometer  An incentive spirometer is a tool that measures how well you are filling your lungs with each breath. Learning to take long, deep breaths using this tool can help you keep your lungs clear and active. This may help to reverse or lessen your chance of developing breathing (pulmonary) problems, especially infection. You may be asked to use a spirometer: After a surgery. If you have a lung problem or a history of smoking. After a long period of time when you have been unable to move or be active. If the spirometer includes an indicator to show the highest number that you have reached, your health care provider or respiratory therapist will help you set a goal. Keep a log of your progress as  told by your health care provider. What are the risks? Breathing too quickly may cause dizziness or cause you to pass out. Take your time so you do not get dizzy or light-headed. If you are in pain, you may need to take pain medicine before doing incentive spirometry. It is harder to take a deep breath if you are having pain. How to use your incentive spirometer  Sit up on the edge of your bed or on a chair. Hold the incentive spirometer so that it is in an upright position. Before you use the spirometer, breathe out normally. Place the mouthpiece in your mouth. Make sure your lips are closed tightly around it. Breathe in slowly and as deeply as you can through your mouth, causing the piston or the ball to rise toward the top of the chamber. Hold your breath for 3-5 seconds, or for as long as possible. If the spirometer includes a coach indicator, use this to guide you in breathing. Slow down your breathing if the indicator goes above the marked areas. Remove the mouthpiece from your mouth and breathe out normally. The piston or ball will return to the bottom of the chamber. Rest for a few seconds, then repeat the steps 10 or more times. Take your time and take a few normal breaths between deep breaths so that you do not get dizzy or light-headed. Do this every 1-2 hours when you are awake. If the spirometer includes a goal marker to show the highest number you have reached (best effort), use this as a goal to work toward during each repetition. After each set of 10 deep breaths, cough a few times. This will help to make sure that your lungs are clear. If you have an incision on your chest or abdomen from surgery, place a pillow or a rolled-up towel firmly against the incision when you cough. This can help to reduce pain while taking deep breaths and coughing. General tips When you are able to get out of bed: Walk around often. Continue to take deep breaths and cough in order to clear your  lungs. Keep using the incentive spirometer until your health care provider says it is okay to stop using it. If you have been in the hospital, you may be told to keep using the spirometer at home. Contact a health care provider if: You are having difficulty using the spirometer. You have trouble using the spirometer as often as instructed. Your pain medicine is not giving enough relief for you to use  the spirometer as told. You have a fever. Get help right away if: You develop shortness of breath. You develop a cough with bloody mucus from the lungs. You have fluid or blood coming from an incision site after you cough. Summary An incentive spirometer is a tool that can help you learn to take long, deep breaths to keep your lungs clear and active. You may be asked to use a spirometer after a surgery, if you have a lung problem or a history of smoking, or if you have been inactive for a long period of time. Use your incentive spirometer as instructed every 1-2 hours while you are awake. If you have an incision on your chest or abdomen, place a pillow or a rolled-up towel firmly against your incision when you cough. This will help to reduce pain. Get help right away if you have shortness of breath, you cough up bloody mucus, or blood comes from your incision when you cough. This information is not intended to replace advice given to you by your health care provider. Make sure you discuss any questions you have with your health care provider. Document Revised: 05/19/2019 Document Reviewed: 05/19/2019 Elsevier Patient Education  [place].

## 2022-06-19 ENCOUNTER — Encounter
Admission: RE | Admit: 2022-06-19 | Discharge: 2022-06-19 | Disposition: A | Discharge status: Home / Self Care | Payer: Medicare HMO | Source: Ambulatory Visit | Attending: Orthopedic Surgery | Admitting: Orthopedic Surgery

## 2022-06-19 ENCOUNTER — Encounter: Payer: Self-pay | Admitting: Urgent Care

## 2022-06-19 DIAGNOSIS — R9439 Abnormal result of other cardiovascular function study: Secondary | ICD-10-CM

## 2022-06-19 DIAGNOSIS — E782 Mixed hyperlipidemia: Secondary | ICD-10-CM | POA: Diagnosis not present

## 2022-06-19 DIAGNOSIS — I2089 Other forms of angina pectoris: Secondary | ICD-10-CM | POA: Diagnosis not present

## 2022-06-19 DIAGNOSIS — I10 Essential (primary) hypertension: Secondary | ICD-10-CM

## 2022-06-19 DIAGNOSIS — Z01810 Encounter for preprocedural cardiovascular examination: Secondary | ICD-10-CM | POA: Diagnosis present

## 2022-06-19 DIAGNOSIS — Z950 Presence of cardiac pacemaker: Secondary | ICD-10-CM

## 2022-06-19 DIAGNOSIS — I4510 Unspecified right bundle-branch block: Secondary | ICD-10-CM | POA: Insufficient documentation

## 2022-06-19 DIAGNOSIS — I25118 Atherosclerotic heart disease of native coronary artery with other forms of angina pectoris: Secondary | ICD-10-CM

## 2022-06-20 ENCOUNTER — Ambulatory Visit: Payer: Medicare HMO | Attending: Internal Medicine | Admitting: Internal Medicine

## 2022-06-20 ENCOUNTER — Encounter: Payer: Self-pay | Admitting: Internal Medicine

## 2022-06-20 VITALS — BP 160/87 | HR 61 | Ht 72.0 in | Wt 209.0 lb

## 2022-06-20 DIAGNOSIS — Z950 Presence of cardiac pacemaker: Secondary | ICD-10-CM

## 2022-06-20 DIAGNOSIS — I951 Orthostatic hypotension: Secondary | ICD-10-CM | POA: Diagnosis not present

## 2022-06-20 DIAGNOSIS — I495 Sick sinus syndrome: Secondary | ICD-10-CM

## 2022-06-20 DIAGNOSIS — I498 Other specified cardiac arrhythmias: Secondary | ICD-10-CM

## 2022-06-20 LAB — PACEMAKER DEVICE OBSERVATION

## 2022-06-20 NOTE — Progress Notes (Signed)
PERIOPERATIVE PRESCRIPTION FOR IMPLANTED CARDIAC DEVICE PROGRAMMING  Patient Information: Name:  Roger Austin  DOB:  Nov 13, 1955  MRN:  [id number]  Planned Procedure: RIGHT SHOULDER ARTHROSCOPIC VS MINI-OPEN ROTATOR CUFF REPAIR WITH POSSIBLE ALLOGRAFT PATCH AUGMENTATION, SUBACROMIAL DECOMPRESSION, DISTAL CLAVICLE EXCISION, AND BICEPS TENODESIS   Surgeon:  Dr. Signa Kell, MD  Requesting device clearance: Quentin Mulling, FNP-C  Date of Procedure:  06/27/2022  Cautery will be used.   Device Information:  Clinic EP Physician:  Sherryl Manges, MD   Device Type:  Pacemaker Manufacturer and Phone #:  Medtronic: [phone] Pacemaker Dependent?:  No. Date of Last Device Check:  06/20/2022 Normal Device Function?:  Yes.    Electrophysiologist's Recommendations:  Have magnet available. Provide continuous ECG monitoring when magnet is used or reprogramming is to be performed.  Procedure may interfere with device function.  Magnet should be placed over device during procedure.  Per Device Clinic Standing Orders, Wiliam Ke, RN  2:49 PM 06/20/2022
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
## 2022-06-20 NOTE — Patient Instructions (Signed)
Medication Instructions:  Your physician recommends that you continue on your current medications as directed. Please refer to the Current Medication list given to you today.  *If you need a refill on your cardiac medications before your next appointment, please call your pharmacy*   Lab Work: None ordered.  If you have labs (blood work) drawn today and your tests are completely normal, you will receive your results only by: MyChart Message (if you have MyChart) OR A paper copy in the mail If you have any lab test that is abnormal or we need to change your treatment, we will call you to review the results.   Testing/Procedures: None ordered.    Follow-Up: At [facility], you and your health needs are our priority.  As part of our continuing mission to provide you with exceptional heart care, we have created designated Provider Care Teams.  These Care Teams include your primary Cardiologist (physician) and Advanced Practice Providers (APPs -  Physician Assistants and Nurse Practitioners) who all work together to provide you with the care you need, when you need it.  We recommend signing up for the patient portal called "MyChart".  Sign up information is provided on this After Visit Summary.  MyChart is used to connect with patients for Virtual Visits (Telemedicine).  Patients are able to view lab/test results, encounter notes, upcoming appointments, etc.  Non-urgent messages can be sent to your provider as well.   To learn more about what you can do with MyChart, go to [email or web address].    Your next appointment:   12 months with Dr Klein 

## 2022-06-26 ENCOUNTER — Encounter: Payer: Self-pay | Admitting: Orthopedic Surgery

## 2022-06-26 MED ORDER — VANCOMYCIN HCL IN DEXTROSE 1-5 GM/200ML-% IV SOLN
1000.0000 mg | INTRAVENOUS | Status: AC
  Administered 2022-06-27: 1000 mg via INTRAVENOUS
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
## 2022-06-27 ENCOUNTER — Other Ambulatory Visit: Payer: Self-pay

## 2022-06-27 ENCOUNTER — Ambulatory Visit: Payer: Medicare HMO | Admitting: Urgent Care

## 2022-06-27 ENCOUNTER — Ambulatory Visit: Payer: Medicare HMO

## 2022-06-27 ENCOUNTER — Encounter
Admission: RE | Disposition: A | Discharge status: Home / Self Care | Payer: Self-pay | Source: Ambulatory Visit | Attending: Orthopedic Surgery

## 2022-06-27 ENCOUNTER — Ambulatory Visit (INDEPENDENT_AMBULATORY_CARE_PROVIDER_SITE_OTHER): Payer: Medicare HMO

## 2022-06-27 ENCOUNTER — Encounter: Payer: Self-pay | Admitting: Orthopedic Surgery

## 2022-06-27 ENCOUNTER — Ambulatory Visit
Admission: RE | Admit: 2022-06-27 | Discharge: 2022-06-27 | Disposition: A | Discharge status: Home / Self Care | Payer: Medicare HMO | Source: Ambulatory Visit | Attending: Orthopedic Surgery | Admitting: Orthopedic Surgery

## 2022-06-27 DIAGNOSIS — Z955 Presence of coronary angioplasty implant and graft: Secondary | ICD-10-CM | POA: Insufficient documentation

## 2022-06-27 DIAGNOSIS — I2510 Atherosclerotic heart disease of native coronary artery without angina pectoris: Secondary | ICD-10-CM | POA: Diagnosis not present

## 2022-06-27 DIAGNOSIS — X58XXXA Exposure to other specified factors, initial encounter: Secondary | ICD-10-CM | POA: Insufficient documentation

## 2022-06-27 DIAGNOSIS — S46211A Strain of muscle, fascia and tendon of other parts of biceps, right arm, initial encounter: Secondary | ICD-10-CM | POA: Insufficient documentation

## 2022-06-27 DIAGNOSIS — F419 Anxiety disorder, unspecified: Secondary | ICD-10-CM | POA: Diagnosis not present

## 2022-06-27 DIAGNOSIS — E785 Hyperlipidemia, unspecified: Secondary | ICD-10-CM | POA: Diagnosis not present

## 2022-06-27 DIAGNOSIS — Z950 Presence of cardiac pacemaker: Secondary | ICD-10-CM | POA: Insufficient documentation

## 2022-06-27 DIAGNOSIS — L4050 Arthropathic psoriasis, unspecified: Secondary | ICD-10-CM | POA: Diagnosis not present

## 2022-06-27 DIAGNOSIS — S46011A Strain of muscle(s) and tendon(s) of the rotator cuff of right shoulder, initial encounter: Secondary | ICD-10-CM | POA: Diagnosis not present

## 2022-06-27 DIAGNOSIS — G4733 Obstructive sleep apnea (adult) (pediatric): Secondary | ICD-10-CM | POA: Insufficient documentation

## 2022-06-27 DIAGNOSIS — M25811 Other specified joint disorders, right shoulder: Secondary | ICD-10-CM | POA: Diagnosis not present

## 2022-06-27 DIAGNOSIS — M19011 Primary osteoarthritis, right shoulder: Secondary | ICD-10-CM | POA: Insufficient documentation

## 2022-06-27 DIAGNOSIS — I495 Sick sinus syndrome: Secondary | ICD-10-CM

## 2022-06-27 DIAGNOSIS — I10 Essential (primary) hypertension: Secondary | ICD-10-CM | POA: Diagnosis not present

## 2022-06-27 DIAGNOSIS — M75101 Unspecified rotator cuff tear or rupture of right shoulder, not specified as traumatic: Secondary | ICD-10-CM | POA: Diagnosis present

## 2022-06-27 HISTORY — DX: Obstructive sleep apnea (adult) (pediatric): G47.33

## 2022-06-27 HISTORY — DX: Long term (current) use of unspecified immunomodulators and immunosuppressants: Z79.60

## 2022-06-27 HISTORY — DX: Other long term (current) drug therapy: Z79.899

## 2022-06-27 HISTORY — DX: Unspecified malignant neoplasm of skin of other parts of face: C44.309

## 2022-06-27 SURGERY — SHOULDER ARTHROSCOPY WITH SUBACROMIAL DECOMPRESSION AND DISTAL CLAVICLE EXCISION
Anesthesia: General | Site: Shoulder | Laterality: Right

## 2022-06-27 MED ORDER — ROCURONIUM BROMIDE 10 MG/ML (PF) SYRINGE
PREFILLED_SYRINGE | INTRAVENOUS | Status: AC
  Filled 2022-06-27: qty 10

## 2022-06-27 MED ORDER — PHENYLEPHRINE HCL (PRESSORS) 10 MG/ML IV SOLN
INTRAVENOUS | Status: DC | PRN
  Administered 2022-06-27 (×2): 80 ug via INTRAVENOUS
  Administered 2022-06-27: 160 ug via INTRAVENOUS
  Administered 2022-06-27 (×2): 80 ug via INTRAVENOUS
  Administered 2022-06-27 (×2): 160 ug via INTRAVENOUS

## 2022-06-27 MED ORDER — CHLORHEXIDINE GLUCONATE 0.12 % MT SOLN
15.0000 mL | Freq: Once | OROMUCOSAL | Status: AC
  Administered 2022-06-27: 15 mL via OROMUCOSAL

## 2022-06-27 MED ORDER — PROPOFOL 10 MG/ML IV BOLUS
INTRAVENOUS | Status: DC | PRN
  Administered 2022-06-27: 150 mg via INTRAVENOUS

## 2022-06-27 MED ORDER — ACETAMINOPHEN 10 MG/ML IV SOLN
INTRAVENOUS | Status: DC | PRN
  Administered 2022-06-27: 1000 mg via INTRAVENOUS

## 2022-06-27 MED ORDER — PHENYLEPHRINE HCL-NACL 20-0.9 MG/250ML-% IV SOLN
INTRAVENOUS | Status: DC | PRN
  Administered 2022-06-27: 20 ug/min via INTRAVENOUS

## 2022-06-27 MED ORDER — MIDAZOLAM HCL 2 MG/2ML IJ SOLN
INTRAMUSCULAR | Status: AC
  Filled 2022-06-27: qty 2

## 2022-06-27 MED ORDER — FENTANYL CITRATE (PF) 100 MCG/2ML IJ SOLN
INTRAMUSCULAR | Status: AC
  Filled 2022-06-27: qty 2

## 2022-06-27 MED ORDER — BUPIVACAINE HCL (PF) 0.5 % IJ SOLN
INTRAMUSCULAR | Status: DC | PRN
  Administered 2022-06-27: 7 mL via PERINEURAL
  Administered 2022-06-27: 3 mL via PERINEURAL

## 2022-06-27 MED ORDER — VANCOMYCIN HCL IN DEXTROSE 1-5 GM/200ML-% IV SOLN
INTRAVENOUS | Status: AC
  Filled 2022-06-27: qty 200

## 2022-06-27 MED ORDER — CHLORHEXIDINE GLUCONATE 0.12 % MT SOLN
OROMUCOSAL | Status: AC
  Filled 2022-06-27: qty 15

## 2022-06-27 MED ORDER — OXYCODONE HCL 5 MG PO TABS: 5.0000 mg | ORAL_TABLET | ORAL | 0 refills | Status: AC | PRN

## 2022-06-27 MED ORDER — LACTATED RINGERS IV SOLN: INTRAVENOUS | Status: DC

## 2022-06-27 MED ORDER — BUPIVACAINE LIPOSOME 1.3 % IJ SUSP
INTRAMUSCULAR | Status: AC
  Filled 2022-06-27: qty 20

## 2022-06-27 MED ORDER — LIDOCAINE HCL (CARDIAC) PF 100 MG/5ML IV SOSY
PREFILLED_SYRINGE | INTRAVENOUS | Status: DC | PRN
  Administered 2022-06-27: 100 mg via INTRAVENOUS

## 2022-06-27 MED ORDER — PROPOFOL 10 MG/ML IV BOLUS
INTRAVENOUS | Status: AC
  Filled 2022-06-27: qty 20

## 2022-06-27 MED ORDER — ACETAMINOPHEN 500 MG PO TABS: 1000.0000 mg | ORAL_TABLET | Freq: Three times a day (TID) | ORAL | 2 refills | Status: AC

## 2022-06-27 MED ORDER — OXYCODONE HCL 5 MG PO TABS: 5.0000 mg | ORAL_TABLET | Freq: Once | ORAL | Status: DC | PRN

## 2022-06-27 MED ORDER — RINGERS IRRIGATION IR SOLN
Status: DC | PRN
  Administered 2022-06-27: 6000 mL
  Administered 2022-06-27 (×3): 3000 mL
  Administered 2022-06-27: 6000 mL
  Administered 2022-06-27: 3000 mL

## 2022-06-27 MED ORDER — ROCURONIUM BROMIDE 100 MG/10ML IV SOLN
INTRAVENOUS | Status: DC | PRN
  Administered 2022-06-27: 10 mg via INTRAVENOUS
  Administered 2022-06-27: 20 mg via INTRAVENOUS
  Administered 2022-06-27: 50 mg via INTRAVENOUS
  Administered 2022-06-27: 10 mg via INTRAVENOUS

## 2022-06-27 MED ORDER — BUPIVACAINE LIPOSOME 1.3 % IJ SUSP
INTRAMUSCULAR | Status: DC | PRN
  Administered 2022-06-27: 7 mL via PERINEURAL
  Administered 2022-06-27: 13 mL via PERINEURAL

## 2022-06-27 MED ORDER — FENTANYL CITRATE PF 50 MCG/ML IJ SOSY
50.0000 ug | PREFILLED_SYRINGE | Freq: Once | INTRAMUSCULAR | Status: AC
  Administered 2022-06-27: 50 ug via INTRAVENOUS

## 2022-06-27 MED ORDER — OXYCODONE HCL 5 MG/5ML PO SOLN: 5.0000 mg | Freq: Once | ORAL | Status: DC | PRN

## 2022-06-27 MED ORDER — LACTATED RINGERS IV SOLN
INTRAVENOUS | Status: DC | PRN
  Administered 2022-06-27 (×4): 3001 mL

## 2022-06-27 MED ORDER — FENTANYL CITRATE PF 50 MCG/ML IJ SOSY
PREFILLED_SYRINGE | INTRAMUSCULAR | Status: AC
  Filled 2022-06-27: qty 1

## 2022-06-27 MED ORDER — ONDANSETRON HCL 4 MG/2ML IJ SOLN
INTRAMUSCULAR | Status: DC | PRN
  Administered 2022-06-27: 4 mg via INTRAVENOUS

## 2022-06-27 MED ORDER — DEXAMETHASONE SODIUM PHOSPHATE 10 MG/ML IJ SOLN
INTRAMUSCULAR | Status: AC
  Filled 2022-06-27: qty 1

## 2022-06-27 MED ORDER — EPINEPHRINE PF 1 MG/ML IJ SOLN
INTRAMUSCULAR | Status: AC
  Filled 2022-06-27: qty 4

## 2022-06-27 MED ORDER — ONDANSETRON 4 MG PO TBDP: 4.0000 mg | ORAL_TABLET | Freq: Three times a day (TID) | ORAL | 0 refills | Status: AC | PRN

## 2022-06-27 MED ORDER — FENTANYL CITRATE (PF) 100 MCG/2ML IJ SOLN: 25.0000 ug | INTRAMUSCULAR | Status: DC | PRN

## 2022-06-27 MED ORDER — BUPIVACAINE HCL (PF) 0.5 % IJ SOLN
INTRAMUSCULAR | Status: AC
  Filled 2022-06-27: qty 10

## 2022-06-27 MED ORDER — ORAL CARE MOUTH RINSE: 15.0000 mL | Freq: Once | OROMUCOSAL | Status: AC

## 2022-06-27 MED ORDER — ASPIRIN 325 MG PO TBEC: 325.0000 mg | DELAYED_RELEASE_TABLET | Freq: Every day | ORAL | 0 refills | Status: AC

## 2022-06-27 MED ORDER — PHENYLEPHRINE HCL-NACL 20-0.9 MG/250ML-% IV SOLN
INTRAVENOUS | Status: AC
  Filled 2022-06-27: qty 250

## 2022-06-27 MED ORDER — MIDAZOLAM HCL 2 MG/2ML IJ SOLN
1.0000 mg | Freq: Once | INTRAMUSCULAR | Status: AC
  Administered 2022-06-27: 1 mg via INTRAVENOUS

## 2022-06-27 MED ORDER — DEXAMETHASONE SODIUM PHOSPHATE 10 MG/ML IJ SOLN
INTRAMUSCULAR | Status: DC | PRN
  Administered 2022-06-27: 10 mg via INTRAVENOUS

## 2022-06-27 MED ORDER — FENTANYL CITRATE (PF) 100 MCG/2ML IJ SOLN
INTRAMUSCULAR | Status: DC | PRN
  Administered 2022-06-27: 50 ug via INTRAVENOUS
  Administered 2022-06-27 (×2): 25 ug via INTRAVENOUS

## 2022-06-27 MED ORDER — LIDOCAINE HCL (PF) 1 % IJ SOLN
INTRAMUSCULAR | Status: AC
  Filled 2022-06-27: qty 5

## 2022-06-27 MED ORDER — ACETAMINOPHEN 10 MG/ML IV SOLN
INTRAVENOUS | Status: AC
  Filled 2022-06-27: qty 100

## 2022-06-27 MED ORDER — ONDANSETRON HCL 4 MG/2ML IJ SOLN
INTRAMUSCULAR | Status: AC
  Filled 2022-06-27: qty 2

## 2022-06-27 SURGICAL SUPPLY — 92 items
ADAPTER IRRIG TUBE 2 SPIKE SOL (ADAPTER) ×2 IMPLANT
ADPR TBG 2 SPK PMP STRL ASCP (ADAPTER) ×2
ANCH SUT 2 FBRTK KNTLS 1.8 (Anchor) ×4 IMPLANT
ANCH SUT 2X2.3 TAPE (Anchor) ×2 IMPLANT
ANCH SUT 5 3.9 CRKSW KNTLS (Anchor) ×1 IMPLANT
ANCH SUT TGRTAPE 1.3X2.6X1.7 (Anchor) ×1 IMPLANT
ANCHOR 2.3 SP SGL 1.2 XBRAID (Anchor) IMPLANT
ANCHOR 3.9 PEEK CORKSCREW 5MTS (Anchor) IMPLANT
ANCHOR SUT 1.8 FIBERTAK SB KL (Anchor) IMPLANT
ANCHOR SUT FBRTK 2.6 SP1.7 TAP (Anchor) IMPLANT
ANCHOR SWIVELOCK SP KL 4.75 (Anchor) IMPLANT
APL PRP STRL LF DISP 70% ISPRP (MISCELLANEOUS) ×1
BLADE SHAVER 4.5X7 STR FR (MISCELLANEOUS) ×1 IMPLANT
BRUSH SCRUB EZ  4% CHG (MISCELLANEOUS) ×1
BRUSH SCRUB EZ 4% CHG (MISCELLANEOUS) ×1 IMPLANT
BUR BR 5.5 WIDE MOUTH (BURR) IMPLANT
CANNULA PART THRD DISP 5.75X7 (CANNULA) IMPLANT
CANNULA PARTIAL THREAD 2X7 (CANNULA) ×1 IMPLANT
CANNULA TWIST IN 8.25X9CM (CANNULA) IMPLANT
CHLORAPREP W/TINT 26 (MISCELLANEOUS) ×1 IMPLANT
COOLER POLAR GLACIER W/PUMP (MISCELLANEOUS) ×1 IMPLANT
DEVICE SUCT BLK HOLE OR FLOOR (MISCELLANEOUS) ×2 IMPLANT
DRAPE 3/4 80X56 (DRAPES) ×1 IMPLANT
DRAPE INCISE IOBAN 66X45 STRL (DRAPES) ×1 IMPLANT
DRAPE STERI 35X30 U-POUCH (DRAPES) ×1 IMPLANT
DRAPE U-SHAPE 47X51 STRL (DRAPES) ×2 IMPLANT
DRSG TEGADERM 4X4.75 (GAUZE/BANDAGES/DRESSINGS) ×2 IMPLANT
ELECT REM PT RETURN 9FT ADLT (ELECTROSURGICAL)
ELECTRODE REM PT RTRN 9FT ADLT (ELECTROSURGICAL) IMPLANT
GAUZE SPONGE 4X4 12PLY STRL (GAUZE/BANDAGES/DRESSINGS) ×1 IMPLANT
GAUZE XEROFORM 1X8 LF (GAUZE/BANDAGES/DRESSINGS) ×1 IMPLANT
GLOVE BIO SURGEON STRL SZ7.5 (GLOVE) ×1 IMPLANT
GLOVE BIOGEL PI IND STRL 8 (GLOVE) ×2 IMPLANT
GLOVE SURG ORTHO 8.0 STRL STRW (GLOVE) ×1 IMPLANT
GLOVE SURG SYN 7.5  E (GLOVE) ×1
GLOVE SURG SYN 7.5 E (GLOVE) ×1 IMPLANT
GLOVE SURG SYN 7.5 PF PI (GLOVE) ×1 IMPLANT
GOWN STRL REUS W/ TWL LRG LVL3 (GOWN DISPOSABLE) ×2 IMPLANT
GOWN STRL REUS W/ TWL XL LVL3 (GOWN DISPOSABLE) ×1 IMPLANT
GOWN STRL REUS W/TWL LRG LVL3 (GOWN DISPOSABLE) ×2
GOWN STRL REUS W/TWL LRG LVL4 (GOWN DISPOSABLE) ×1 IMPLANT
GOWN STRL REUS W/TWL XL LVL3 (GOWN DISPOSABLE) ×1
GRAFT TISS 40X70 3 THK DERM (Tissue) IMPLANT
IV LACTATED RINGER IRRG 3000ML (IV SOLUTION) ×8
IV LR IRRIG 3000ML ARTHROMATIC (IV SOLUTION) ×4 IMPLANT
KIT CORKSCREW KNTLS 3.9 S/T/P (INSTRUMENTS) IMPLANT
KIT SPEAR STR 1.6MM DRILL (MISCELLANEOUS) IMPLANT
KIT STABILIZATION SHOULDER (MISCELLANEOUS) ×1 IMPLANT
KIT SUTURETAK 3.0 INSERT PERC (KITS) ×1 IMPLANT
KIT TURNOVER KIT A (KITS) ×1 IMPLANT
MANIFOLD NEPTUNE II (INSTRUMENTS) ×2 IMPLANT
MASK FACE SPIDER DISP (MASK) ×1 IMPLANT
MAT ABSORB  FLUID 56X50 GRAY (MISCELLANEOUS) ×2
MAT ABSORB FLUID 56X50 GRAY (MISCELLANEOUS) ×2 IMPLANT
NDL HD SCORPION MEGA LOADER (NEEDLE) IMPLANT
NDL HYPO 22X1.5 SAFETY MO (MISCELLANEOUS) ×1 IMPLANT
NDL SAFETY ECLIP 18X1.5 (MISCELLANEOUS) ×1 IMPLANT
NEEDLE HYPO 22X1.5 SAFETY MO (MISCELLANEOUS) ×1 IMPLANT
NS IRRIG 500ML POUR BTL (IV SOLUTION) ×1 IMPLANT
PACK ARTHROSCOPY SHOULDER (MISCELLANEOUS) ×1 IMPLANT
PAD ABD DERMACEA PRESS 5X9 (GAUZE/BANDAGES/DRESSINGS) ×1 IMPLANT
PAD ARMBOARD 7.5X6 YLW CONV (MISCELLANEOUS) ×1 IMPLANT
PAD WRAPON POLAR SHDR XLG (MISCELLANEOUS) ×1 IMPLANT
PASSER SUT FIRSTPASS SELF (INSTRUMENTS) ×1 IMPLANT
SHAVER BLADE BONE CUTTER  5.5 (BLADE)
SHAVER BLADE BONE CUTTER 5.5 (BLADE) IMPLANT
SLEEVE REMOTE CONTROL 5X12 (DRAPES) IMPLANT
SLING ULTRA II M (MISCELLANEOUS) ×1 IMPLANT
SPONGE T-LAP 18X18 ~~LOC~~+RFID (SPONGE) ×1 IMPLANT
STRAP SAFETY 5IN WIDE (MISCELLANEOUS) ×1 IMPLANT
SUT ETHILON 3-0 FS-10 30 BLK (SUTURE) ×1
SUT LASSO 90 DEG SD STR (SUTURE) ×1 IMPLANT
SUT MNCRL 4-0 (SUTURE)
SUT MNCRL 4-0 27XMFL (SUTURE)
SUT PDS AB 0 CT1 27 (SUTURE) IMPLANT
SUT VIC AB 0 CT1 36 (SUTURE) ×1 IMPLANT
SUT VIC AB 2-0 CT2 27 (SUTURE) ×1 IMPLANT
SUTURE EHLN 3-0 FS-10 30 BLK (SUTURE) ×1 IMPLANT
SUTURE MNCRL 4-0 27XMF (SUTURE) ×1 IMPLANT
SUTURE TAPE 1.3 40 TPR END (SUTURE) IMPLANT
SUTURETAPE 1.3 40 TPR END (SUTURE) ×3
SYR 10ML LL (SYRINGE) ×1 IMPLANT
TAPE CLOTH 3X10 WHT NS LF (GAUZE/BANDAGES/DRESSINGS) ×1 IMPLANT
TISSUE ARTHOFLEX THICK 3MM (Tissue) ×1 IMPLANT
TRAP FLUID SMOKE EVACUATOR (MISCELLANEOUS) ×1 IMPLANT
TUBE SET DOUBLEFLO INFLOW (TUBING) ×1 IMPLANT
TUBE SET DOUBLEFLO OUTFLOW (TUBING) ×1 IMPLANT
TUBING CONNECTING 10 (TUBING) ×1 IMPLANT
WAND WEREWOLF FLOW 90D (MISCELLANEOUS) ×1 IMPLANT
WATER STERILE IRR 500ML POUR (IV SOLUTION) ×1 IMPLANT
WRAP SHOULDER HOT/COLD PACK (SOFTGOODS) ×1 IMPLANT
WRAPON POLAR PAD SHDR XLG (MISCELLANEOUS) ×1

## 2022-06-27 NOTE — H&P (Signed)
Paper H&P to be scanned into permanent record. H&P reviewed. No significant changes noted.  
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
## 2022-06-27 NOTE — Discharge Instructions (Addendum)
Post-Op Instructions - Rotator Cuff Repair  1. Bracing: You will wear a shoulder immobilizer or sling for 6-8 weeks.   2. Driving: No driving for 6 weeks post-op. When driving, do not wear the immobilizer.   3. Activity: No active lifting for 2 months. Wrist, hand, and elbow motion only. Avoid lifting the upper arm away from the body except for hygiene. You are permitted to bend and straighten the elbow passively only (no active elbow motion). You may use your hand and wrist for typing, writing, and managing utensils (cutting food). Do not lift more than a coffee cup for 8 weeks.  When sleeping or resting, inclined positions (recliner chair or wedge pillow) and a pillow under the forearm for support may provide better comfort for up to 4 weeks.  Avoid long distance travel for 4 weeks.  Return to normal activities after rotator cuff repair repair normally takes 6-9 months on average. If rehab goes very well, may be able to do most activities at 4 months, except overhead or contact sports.  4. Physical Therapy: Begins 3-4 days after surgery, and then skip 4 weeks, then 1-2 times per week from weeks 4-20 post-op.  5. Medications:  - You will be provided a prescription for narcotic pain medicine. After surgery, take 1-2 narcotic tablets every 4 hours if needed for severe pain.  - A prescription for anti-nausea medication will be provided in case the narcotic medicine causes nausea - take 1 tablet every 6 hours only if nauseated.   - Take tylenol 1000 mg (2 Extra Strength tablets or 3 regular strength) every 8 hours for pain.  May decrease or stop tylenol 5 days after surgery if you are having minimal pain. - Take ASA /day x 2 weeks to help prevent DVTs/PEs (blood clots).  - DO NOT take ANY nonsteroidal anti-inflammatory pain medications (Advil, Motrin, Ibuprofen, Aleve, Naproxen, or Naprosyn). These medicines can inhibit healing of your shoulder repair.    If you are taking prescription  medication for anxiety, depression, insomnia, muscle spasm, chronic pain, or for attention deficit disorder, you are advised that you are at a higher risk of adverse effects with use of narcotics post-op, including narcotic addiction/dependence, depressed breathing, death. If you use non-prescribed substances: alcohol, marijuana, cocaine, heroin, methamphetamines, etc., you are at a higher risk of adverse effects with use of narcotics post-op, including narcotic addiction/dependence, depressed breathing, death. You are advised that taking > 50 morphine milligram equivalents (MME) of narcotic pain medication per day results in twice the risk of overdose or death. For your prescription provided: oxycodone 5 mg - taking more than 6 tablets per day would result in > 50 morphine milligram equivalents (MME) of narcotic pain medication. Be advised that we will prescribe narcotics short-term, for acute post-operative pain only - 3 weeks for major operations such as shoulder repair/reconstruction surgeries.     6. Post-Op Appointment:  Your first post-op appointment will be 10-14 days post-op.  7. Work or School: For most, but not all procedures, we advise staying out of work or school for at least 1 to 2 weeks in order to recover from the stress of surgery and to allow time for healing.   If you need a work or school note this can be provided.   8. Smoking: If you are a smoker, you need to refrain from smoking in the postoperative period. The nicotine in cigarettes will inhibit healing of your shoulder repair and decrease the chance of successful repair. Similarly, nicotine  containing products (gum, patches) should be avoided.   Post-operative Brace: Apply and remove the brace you received as you were instructed to at the time of fitting and as described in detail as the brace's instructions for use indicate.  Wear the brace for the period of time prescribed by your physician.  The brace can be cleaned  with soap and water and allowed to air dry only.  Should the brace result in increased pain, decreased feeling (numbness/tingling), increased swelling or an overall worsening of your medical condition, please contact your doctor immediately.  If an emergency situation occurs as a result of wearing the brace after normal business hours, please dial 911 and seek immediate medical attention.  Let your doctor know if you have any further questions about the brace issued to you. Refer to the shoulder sling instructions for use if you have any questions regarding the correct fit of your shoulder sling.  [facility] Customer Care for Troubleshooting: [phone]  Video that illustrates how to properly use a shoulder sling: "Instructions for Proper Use of an Orthopaedic Sling" http://bass.com/    AMBULATORY SURGERY  DISCHARGE INSTRUCTIONS   The drugs that you were given will stay in your system until tomorrow so for the next 24 hours you should not:  Drive an automobile Make any legal decisions Drink any alcoholic beverage   You may resume regular meals tomorrow.  Today it is better to start with liquids and gradually work up to solid foods.  You may eat anything you prefer, but it is better to start with liquids, then soup and crackers, and gradually work up to solid foods.   Please notify your doctor immediately if you have any unusual bleeding, trouble breathing, redness and pain at the surgery site, drainage, fever, or pain not relieved by medication.    Additional Instructions: PLEASE LEAVE TEAL (EXPAREL) ARMBAND ON FOR 4 DAYS    Please contact your physician with any problems or Same Day Surgery at [phone], Monday through Friday 6 am to 4 pm, or [place] at [facility] number at [phone].  POLAR CARE INFORMATION  MassAdvertisement.it  How to use [employer] Polar Care [facility] Therapy System?  YouTube    ShippingScam.co.uk  OPERATING INSTRUCTIONS  Start the product With dry hands, connect the transformer to the electrical connection located on the top of the cooler. Next, plug the transformer into an appropriate electrical outlet. The unit will automatically start running at this point.  To stop the pump, disconnect electrical power.  Unplug to stop the product when not in use. Unplugging the Polar Care unit turns it off. Always unplug immediately after use. Never leave it plugged in while unattended. Remove pad.    FIRST ADD WATER TO FILL LINE, THEN ICE---Replace ice when existing ice is almost melted  1 Discuss Treatment with your Licensed Health Care Practitioner and Use Only as Prescribed 2 Apply Insulation Barrier & Cold Therapy Pad 3 Check for Moisture 4 Inspect Skin Regularly  Tips and Trouble Shooting Usage Tips 1. Use cubed or chunked ice for optimal performance. 2. It is recommended to drain the Pad between uses. To drain the pad, hold the Pad upright with the hose pointed toward the ground. Depress the black plunger and allow water to drain out. 3. You may disconnect the Pad from the unit without removing the pad from the affected area by depressing the silver tabs on the hose coupling and gently pulling the hoses apart. The Pad and unit will seal  itself and will not leak. Note: Some dripping during release is normal. 4. DO NOT RUN PUMP WITHOUT WATER! The pump in this unit is designed to run with water. Running the unit without water will cause permanent damage to the pump. 5. Unplug unit before removing lid.  TROUBLESHOOTING GUIDE Pump not running, Water not flowing to the pad, Pad is not getting cold 1. Make sure the transformer is plugged into the wall outlet. 2. Confirm that the ice and water are filled to the indicated levels. 3. Make sure there are no kinks in the pad. 4. Gently pull on the blue tube to make sure the tube/pad junction is  straight. 5. Remove the pad from the treatment site and ll it while the pad is lying at; then reapply. 6. Confirm that the pad couplings are securely attached to the unit. Listen for the double clicks (Figure 1) to confirm the pad couplings are securely attached.  Leaks    Note: Some condensation on the lines, controller, and pads is unavoidable, especially in warmer climates. 1. If using a [employer] Polar Care Cold Therapy unit with a detachable Cold Therapy Pad, and a leak exists (other than condensation on the lines) disconnect the pad couplings. Make sure the silver tabs on the couplings are depressed before reconnecting the pad to the pump hose; then confirm both sides of the coupling are properly clicked in. 2. If the coupling continues to leak or a leak is detected in the pad itself, stop using it and call [employer] Customer Care at [phone].  Cleaning After use, empty and dry the unit with a soft cloth. Warm water and mild detergent may be used occasionally to clean the pump and tubes.  WARNING: The Polar Care Cube can be cold enough to cause serious injury, including full skin necrosis. Follow these Operating Instructions, and carefully read the Product Insert (see pouch on side of unit) and the Cold Therapy Pad Fitting Instructions (provided with each Cold Therapy Pad) prior to use.  SHOULDER SLING IMMOBILIZER   VIDEO Slingshot 2 Shoulder Brace Application - YouTube ---https://www.porter.info/  INSTRUCTIONS While supporting the injured arm, slide the forearm into the sling. Wrap the adjustable shoulder strap around the neck and shoulders and attach the strap end to the sling using  the "alligator strap tab."  Adjust the shoulder strap to the required length. Position the shoulder pad behind the neck. To secure the shoulder pad location (optional), pull the shoulder strap away from the shoulder pad, unfold the hook material on the top of the pad, then press the shoulder  strap back onto the hook material to secure the pad in place. Attach the closure strap across the open top of the sling. Position the strap so that it holds the arm securely in the sling. Next, attach the thumb strap to the open end of the sling between the thumb and fingers. After sling has been fit, it may be easily removed and reapplied using the quick release buckle on shoulder strap. If a neutral pillow or 15 abduction pillow is included, place the pillow at the waistline. Attach the sling to the pillow, lining up hook material on the pillow with the loop on sling. Adjust the waist strap to fit.  If waist strap is too long, cut it to fit. Use the small piece of double sided hook material (located on top of the pillow) to secure the strap end. Place the double sided hook material on the inside of the cut  strap end and secure it to the waist strap.     If no pillow is included, attach the waist strap to the sling and adjust to fit.    Washing Instructions: Straps and sling must be removed and cleaned regularly depending on your activity level and perspiration. Hand wash straps and sling in cold water with mild detergent, rinse, air dry        Interscalene Nerve Block with Exparel   For your surgery you have received an Interscalene Nerve Block with Exparel. Nerve Blocks affect many types of nerves, including nerves that control movement, pain and normal sensation.  You may experience feelings such as numbness, tingling, heaviness, weakness or the inability to move your arm or the feeling or sensation that your arm has "fallen asleep". A nerve block with Exparel can last up to 5 days.  Usually the weakness wears off first.  The tingling and heaviness usually wear off next.  Finally you may start to notice pain.  Keep in mind that this may occur in any order.  Once a nerve block starts to wear off it is usually completely gone within 60 minutes. ISNB may cause mild shortness of breath, a hoarse  voice, blurry vision, unequal pupils, or drooping of the face on the same side as the nerve block.  These symptoms will usually resolve with the numbness.  Very rarely the procedure itself can cause mild seizures. If needed, your surgeon will give you a prescription for pain medication.  It will take about 60 minutes for the oral pain medication to become fully effective.  So, it is recommended that you start taking this medication before the nerve block first begins to wear off, or when you first begin to feel discomfort. Take your pain medication only as prescribed.  Pain medication can cause sedation and decrease your breathing if you take more than you need for the level of pain that you have. Nausea is a common side effect of many pain medications.  You may want to eat something before taking your pain medicine to prevent nausea. After an Interscalene nerve block, you cannot feel pain, pressure or extremes in temperature in the effected arm.  Because your arm is numb it is at an increased risk for injury.  To decrease the possibility of injury, please practice the following:  While you are awake change the position of your arm frequently to prevent too much pressure on any one area for prolonged periods of time.  If you have a cast or tight dressing, check the color or your fingers every couple of hours.  Call your surgeon with the appearance of any discoloration (white or blue). If you are given a sling to wear before you go home, please wear it  at all times until the block has completely worn off.  Do not get up at night without your sling. Please contact [facility] or your surgeon if you do not begin to regain sensation after 7 days from the surgery.  Anesthesia may be contacted by calling the Same Day Surgery Department, Mon. through Fri., 6 am to 4 pm at [phone].   If you experience any other problems or concerns, please contact your surgeon's office. If you experience severe or  prolonged shortness of breath go to the nearest emergency department.

## 2022-06-27 NOTE — Anesthesia Procedure Notes (Signed)
Anesthesia Regional Block: Interscalene brachial plexus block   Pre-Anesthetic Checklist: , timeout performed,  Correct Patient, Correct Site, Correct Laterality,  Correct Procedure, Correct Position, site marked,  Risks and benefits discussed,  Surgical consent,  Pre-op evaluation,  At surgeon's request and post-op pain management  Laterality: Upper and Right  Prep: chloraprep       Needles:  Injection technique: Single-shot  Needle Type: Stimiplex     Needle Length: 9cm  Needle Gauge: 22     Additional Needles:   Procedures:,,,, ultrasound used (permanent image in chart),,    Narrative:  Start time: 06/27/2022 7:51 AM End time: 06/27/2022 7:52 AM Injection made incrementally with aspirations every 5 mL.  Performed by: Personally   Additional Notes: Patient consented for risk and benefits of nerve block including but not limited to nerve damage, Horner's syndrome, failed block, bleeding and infection.  Patient voiced understanding.  Functioning IV was confirmed and monitors were applied.  Timeout done prior to procedure and prior to any sedation being given to the patient.  Patient confirmed procedure site prior to any sedation given to the patient.  A 50mm 22ga Stimuplex needle was used. Sterile prep,hand hygiene and sterile gloves were used.  Minimal sedation used for procedure.  No paresthesia endorsed by patient during the procedure.  Negative aspiration and negative test dose prior to incremental administration of local anesthetic. The patient tolerated the procedure well with no immediate complications.

## 2022-06-27 NOTE — Op Note (Addendum)
SURGERY DATE: 06/27/2022    PRE-OP DIAGNOSIS:  1. Right rotator cuff tear (massive supraspinatus & infraspinatus) 2. Right subacromial impingement 3. Right proximal biceps rupture 4. Right acromioclavicular joint arthritis   POST-OP DIAGNOSIS: 1. Right rotator cuff tear (massive supraspinatus & infraspinatus, upper border partial subscapularis) 2. Right subacromial impingement 3. Right proximal biceps rupture 4. Right acromioclavicular joint arthritis     PROCEDURES:  1. Right arthroscopic rotator cuff repair (subscapularis) 2. Right mini-open rotator cuff reconstruction using allograft patch (superior capsular reconstruction) 3. Right arthroscopic extensive debridement of shoulder (glenohumeral and subacromial spaces) 4. Right arthroscopic subacromial decompression 5. Right arthroscopic distal clavicle excision   SURGEON: Rosealee Albee, MD   ASSISTANT: Sonny Dandy, PA   ANESTHESIA: Gen with Exparel interscalene block    ESTIMATED BLOOD LOSS: 50cc   DRAINS:  none   TOTAL IV FLUIDS: per anesthesia      SPECIMENS: none   IMPLANTS:   - Arthrex 4.74mm SwiveLock x 2 (lateral row) - Arthrex Knotless FiberTak x 3 (glenoid fixation) - Arthrex FiberTak Suture Anchor - Double Loaded x 1 (medial row) - Arthrex 3.13mm Knotless Corkscrew (subscapularis repair) - Iconix SPEED all suture anchor double loaded x 2 (medial row) - 3mm Arthroflex dermal allograft x 1   OPERATIVE FINDINGS:  Examination under anesthesia: A careful examination under anesthesia was performed.  Passive range of motion was: FF: 150; ER at side: 50; ER in abduction: 95; IR in abduction: 50.  Anterior load shift: NT.  Posterior load shift: NT.  Sulcus in neutral: NT.  Sulcus in ER: NT.     Intra-operative findings: A thorough arthroscopic examination of the shoulder was performed.  The findings are: 1. Biceps tendon: Not visualized intra-articularly 2. Superior labrum: injected with surrounding synovitis 3.  Posterior labrum and capsule: Degenerative posterior labrum 4. Inferior capsule and inferior recess: normal 5. Glenoid cartilage surface: Normal 6. Supraspinatus attachment: full-thickness tear with retraction to the glenoid 7. Posterior rotator cuff attachment: Full-thickness tear of the anterior infraspinatus 8. Humeral head articular cartilage: Scattered grade 1-2 changes 9. Rotator interval: significant synovitis 10: Subscapularis tendon: Partial-thickness articular sided tear of the superior fibers 11. Anterior labrum: degenerative 12. IGHL: significant synovitis around IGHL   OPERATIVE REPORT:    Indications for procedure: Roger Austin is a 67 y.o. male with approximate 6 months of significantly increased shoulder pain. Since that time, he has failed non-operative management without adequate relief of symptoms  Clinical exam and CT arthrogram were suggestive of massive, retracted rotator cuff tear including supraspinatus and infraspinatus tears; subacromial impingement; and acromioclavicular joint arthritis with known history of proximal biceps rupture.  Given the patient's continued symptoms, we decided to proceed with surgical management after discussion of risks, benefits, and alternatives to surgery.   Procedure in detail:   I identified Roger Austin in the pre-operative holding area.  I marked the operative shoulder with my initials. I reviewed the risks and benefits of the proposed surgical intervention, and the patient (and/or patient's guardian) wished to proceed.  Anesthesia was then performed with an Exparel interscalene block.  The patient was transferred to the operative suite and placed in the beach chair position.     SCDs were placed on the lower extremities. Appropriate IV antibiotics were administered prior to incision. The operative upper extremity was then prepped and draped in standard fashion. A time out was performed confirming the correct extremity,  correct patient, and correct procedure.    I then created a  standard posterior portal with an 11 blade. The glenohumeral joint was easily entered with a blunt trochar and the arthroscope introduced. The findings of diagnostic arthroscopy are described above.  A standard anterior portal was made.  I debrided degenerative tissue including the synovitic tissue about the rotator interval and IGHL as well as the anterior, posterior, and superior labrum. I then coagulated the inflamed synovium in these regions to obtain hemostasis and reduce the risk of post-operative swelling using an Arthrocare radiofrequency device.  The remnant stump of the biceps tendon was also gently debrided.   Next, arthroscopic repair of the subscapularis was performed. The lesser tuberosity footprint was prepared with a combination of electrocautery and an arthroscopic curette.  An Arthrex knotless corkscrew was placed into the lesser tuberosity footprint from the anterior portal.  A BirdBeak was used to shuttle the repair suture through the upper border of the subscapularis tendon.  The suture was then shuttled through the anchor. With the arm in neutral rotation, the repair was tensioned appropriately. This appropriately reduced the subscapularis tear.  The arm was then internally and externally rotated and the subscapularis was noted to move appropriately with rotation.  The remainder of the suture was then cut.   Next, the arthroscope was then introduced into the subacromial space.    A direct lateral portal was created with an 11-blade after spinal needle localization. An extensive subacromial bursectomy was performed using a combination of the shaver and Arthrocare wand. The entire acromial undersurface was exposed and the CA ligament was subperiosteally elevated to expose the anterior acromial hook. A 5.61mm barrel burr was used to create a flat anterior and lateral aspect of the acromion, converting it from a Type 2 to a Type 1  acromion. Care was made to keep the deltoid fascia intact.    I then turned my attention to the arthroscopic distal clavicle excision. I identified the acromioclavicular joint. Surrounding bursal tissue was debrided and the edges of the joint were identified. I used the 5.36mm barrel burr to remove the distal clavicle parallel to the edge of the acromion. I was able to fit two widths of the burr into the space between the distal clavicle and acromion, signifying that I had removed ~13mm of distal clavicle. This was confirmed by viewing anteriorly and introducing a probe with measuring marks from the lateral portal.   Findings of retracted, supraspinatus tear was confirmed.  Tissue superior and inferior to the rotator cuff was released, but despite this, the rotator cuff could not be mobilized to its footprint. Therefore, decision was made to perform a rotator cuff reconstruction by superior capsular reconstruction with allograft patch.   Using an Arthrocare wand, the anterior, superior, and posterior aspects of the glenoid were cleaned of soft tissue as these regions would serve as points of fixation. Through an accessory anterior portal, a Knotless FiberTak anchor was placed onto the anterior glenoid appropriately. Next, percutaneous incisions were made in Neviaser's portal and posteriorly off the posterior edge of the acromion to place the superior and posterior Knotless FiberTaks.  Distance between anchors was measured.  A longitudinal incision from the anterolateral acromion ~6cm in length was made overlying the raphe between the anterior and middle heads of the deltoid.  This incision also incorporated the anterolateral portal.  The raphe was identified and it was incised. The subacromial space was identified. Any remaining bursa was excised.  The rotator cuff footprint was cleared of soft tissue using electrocautery.  A rongeur was used to  create a bleeding bed.  Iconix speed anchors were placed  anteriorly and posteriorly at the articular margin.  An Arthrex FiberTak suture anchor was placed centrally at the articular margin for a total of 3 medial row anchors.  Distance between the glenoid anchors and medial row anchors as well as the medial row anchors themselves was measured.  Based on these measurements, the ArthroFlex 3mm graft was cut appropriately.  The medial glenoid sutures were passed appropriately in a mattress fashion using a scorpion suture passer.  Using the knotless mechanism, all 3 glenoid anchor sutures were shuttled.  Of note, on shuttling the central anchor the suture did not get shuttled appropriately.  Another knotless FiberTak anchor was placed on the central glenoid and this was able to be shuttled appropriately.  Next, the medial row sutures were passed appropriately through the graft.  The graft was then shuttled into an appropriate position by pulling on the glenoid anchor sutures.  Next, the 2 Lateral Row swivel lock anchors were placed in a crossed, linked fashion resulting in compression of the allograft on the rotator cuff footprint.  Side-to-side sutures were placed between the posterior edge of the allograft and the infraspinatus.  Additional anterior side to side sutures were placed between the anterior edge of the graft and the remnant of the anterior subscapularis. The construct was stable with external and internal rotation, and it resisted superior migration of the humeral head.   The wound was thoroughly irrigated.  The deltoid split was closed with 0 Vicryl.  The subdermal layer was closed with 2-0 Vicryl.  The skin was closed with staples. The portals were closed with 3-0 Nylon. Xeroform was applied to the incisions. A sterile dressing was applied, followed by a Polar Care sleeve and a SlingShot shoulder immobilizer/sling. The patient was awakened from anesthesia without difficulty and was transferred to the PACU in stable condition.      COMPLICATIONS: none    DISPOSITION: plan for discharge home after recovery in PACU     POSTOPERATIVE PLAN: Remain in sling (except hygiene and elbow/wrist/hand RoM exercises as instructed by PT) x 6-8 weeks and NWB for this time. PT to begin 3-4 days after surgery and then resume in 4 weeks.  Use massive rotator cuff repair/superior capsular reconstruction rehab protocol with subscapularis repair and biceps tenodesis.  ASA  daily x 2 weeks for DVT ppx.

## 2022-06-27 NOTE — Anesthesia Postprocedure Evaluation (Signed)
Anesthesia Post Note  Patient: Roger Austin  Procedure(s) Performed: Right shoulder arthroscopy with arthroscopic subscapularis repair mini-open superior capsular reconstruction cuff subacromial decompression, distal clavicle excision (Right: Shoulder)  Patient location during evaluation: PACU Anesthesia Type: General Level of consciousness: awake and alert Pain management: pain level controlled Vital Signs Assessment: post-procedure vital signs reviewed and stable Respiratory status: spontaneous breathing, nonlabored ventilation, respiratory function stable and patient connected to nasal cannula oxygen Cardiovascular status: blood pressure returned to baseline and stable Postop Assessment: no apparent nausea or vomiting Anesthetic complications: no   No notable events documented.   Last Vitals:  Vitals:   06/27/22 1310 06/27/22 1316  BP: 109/71 117/76  Pulse: (!) 54 (!) 59  Resp: 15 16  Temp:  (!) 36.2 C  SpO2: 95% 100%    Last Pain:  Vitals:   06/27/22 1316  TempSrc: Temporal  PainSc: 0-No pain                 Cleda Mccreedy Broden Holt

## 2022-06-27 NOTE — Anesthesia Procedure Notes (Signed)
Procedure Name: Intubation Date/Time: 06/27/2022 8:07 AM  Performed by: Monico Hoar, CRNAPre-anesthesia Checklist: Patient identified, Patient being monitored, Timeout performed, Emergency Drugs available and Suction available Patient Re-evaluated:Patient Re-evaluated prior to induction Oxygen Delivery Method: Circle system utilized Preoxygenation: Pre-oxygenation with 100% oxygen Induction Type: IV induction Ventilation: Mask ventilation without difficulty Laryngoscope Size: McGraph and 4 Grade View: Grade I Tube type: Oral Tube size: 7.5 mm Number of attempts: 1 Airway Equipment and Method: Stylet Placement Confirmation: ETT inserted through vocal cords under direct vision, positive ETCO2 and breath sounds checked- equal and bilateral Secured at: 22 cm Tube secured with: Tape Dental Injury: Teeth and Oropharynx as per pre-operative assessment

## 2022-06-27 NOTE — Transfer of Care (Signed)
Immediate Anesthesia Transfer of Care Note  Patient: Roger Austin  Procedure(s) Performed: Right shoulder arthroscopy with arthroscopic subscapularis repair mini-open superior capsular reconstruction cuff subacromial decompression, distal clavicle excision (Right: Shoulder)  Patient Location: PACU  Anesthesia Type:General  Level of Consciousness: awake and drowsy  Airway & Oxygen Therapy: Patient Spontanous Breathing and Patient connected to face mask oxygen  Post-op Assessment: Report given to RN and Post -op Vital signs reviewed and stable  Post vital signs: Reviewed and stable  Last Vitals:  Vitals Value Taken Time  BP 99/67 06/27/22 1230  Temp    Pulse 64 06/27/22 1233  Resp 13 06/27/22 1233  SpO2 99 % 06/27/22 1233  Vitals shown include unvalidated device data.  Last Pain:  Vitals:   06/27/22 0751  TempSrc:   PainSc: 0-No pain         Complications: No notable events documented.

## 2022-06-28 LAB — CUP PACEART REMOTE DEVICE CHECK
Battery Remaining Longevity: 144 mo
Battery Voltage: 3.03 V
Brady Statistic AP VP Percent: 0.24 %
Brady Statistic AP VS Percent: 99.16 %
Brady Statistic AS VP Percent: 0.01 %
Brady Statistic AS VS Percent: 0.59 %
Brady Statistic RA Percent Paced: 99.85 %
Brady Statistic RV Percent Paced: 0.24 %
Date Time Interrogation Session: 20240416013916
Implantable Lead Connection Status: 753985
Implantable Lead Connection Status: 753985
Implantable Lead Implant Date: 20221104
Implantable Lead Implant Date: 20221104
Implantable Lead Location: 753859
Implantable Lead Location: 753860
Implantable Lead Model: 3830
Implantable Lead Model: 5076
Implantable Pulse Generator Implant Date: 20221104
Lead Channel Impedance Value: 285 Ohm
Lead Channel Impedance Value: 380 Ohm
Lead Channel Impedance Value: 418 Ohm
Lead Channel Impedance Value: 551 Ohm
Lead Channel Pacing Threshold Amplitude: 0.5 V
Lead Channel Pacing Threshold Amplitude: 0.5 V
Lead Channel Pacing Threshold Pulse Width: 0.4 ms
Lead Channel Pacing Threshold Pulse Width: 0.4 ms
Lead Channel Sensing Intrinsic Amplitude: 0.625 mV
Lead Channel Sensing Intrinsic Amplitude: 0.625 mV
Lead Channel Sensing Intrinsic Amplitude: 26.25 mV
Lead Channel Sensing Intrinsic Amplitude: 26.25 mV
Lead Channel Setting Pacing Amplitude: 1.5 V
Lead Channel Setting Pacing Amplitude: 2 V
Lead Channel Setting Pacing Pulse Width: 0.4 ms
Lead Channel Setting Sensing Sensitivity: 0.9 mV
Zone Setting Status: 755011
Zone Setting Status: 755011

## 2022-06-28 NOTE — Progress Notes (Addendum)
06/28/22 @ 840AM -Upon post op call pt c/o right chest pain with normal breathing and a severe, sharp, piercing pain with deep breath.  Informed dr piscitello with anesthesia as pt received interscalene block prior to surgery yesterday and pt advised to go to ED for further evaluation.  He verbalized understanding.

## 2022-07-31 NOTE — Progress Notes (Signed)
Remote pacemaker transmission.   

## 2022-09-20 ENCOUNTER — Telehealth: Payer: Self-pay

## 2022-09-20 NOTE — Telephone Encounter (Signed)
Medical clearance paperwork faxed

## 2022-09-26 ENCOUNTER — Ambulatory Visit: Payer: PRIVATE HEALTH INSURANCE

## 2022-09-26 DIAGNOSIS — I495 Sick sinus syndrome: Secondary | ICD-10-CM

## 2022-09-28 LAB — CUP PACEART REMOTE DEVICE CHECK
Battery Remaining Longevity: 142 mo
Battery Voltage: 3.02 V
Brady Statistic AP VP Percent: 0.18 %
Brady Statistic AP VS Percent: 99.41 %
Brady Statistic AS VP Percent: 0.01 %
Brady Statistic AS VS Percent: 0.4 %
Brady Statistic RA Percent Paced: 99.78 %
Brady Statistic RV Percent Paced: 0.19 %
Date Time Interrogation Session: 20240715210331
Implantable Lead Connection Status: 753985
Implantable Lead Connection Status: 753985
Implantable Lead Implant Date: 20221104
Implantable Lead Implant Date: 20221104
Implantable Lead Location: 753859
Implantable Lead Location: 753860
Implantable Lead Model: 3830
Implantable Lead Model: 5076
Implantable Pulse Generator Implant Date: 20221104
Lead Channel Impedance Value: 323 Ohm
Lead Channel Impedance Value: 418 Ohm
Lead Channel Impedance Value: 437 Ohm
Lead Channel Impedance Value: 589 Ohm
Lead Channel Pacing Threshold Amplitude: 0.5 V
Lead Channel Pacing Threshold Amplitude: 0.5 V
Lead Channel Pacing Threshold Pulse Width: 0.4 ms
Lead Channel Pacing Threshold Pulse Width: 0.4 ms
Lead Channel Sensing Intrinsic Amplitude: 2.25 mV
Lead Channel Sensing Intrinsic Amplitude: 2.25 mV
Lead Channel Sensing Intrinsic Amplitude: 25.875 mV
Lead Channel Sensing Intrinsic Amplitude: 25.875 mV
Lead Channel Setting Pacing Amplitude: 1.5 V
Lead Channel Setting Pacing Amplitude: 2 V
Lead Channel Setting Pacing Pulse Width: 0.4 ms
Lead Channel Setting Sensing Sensitivity: 0.9 mV
Zone Setting Status: 755011
Zone Setting Status: 755011

## 2022-10-13 NOTE — Progress Notes (Signed)
Remote pacemaker transmission.   

## 2022-12-26 ENCOUNTER — Ambulatory Visit (INDEPENDENT_AMBULATORY_CARE_PROVIDER_SITE_OTHER): Payer: PRIVATE HEALTH INSURANCE

## 2022-12-26 DIAGNOSIS — I495 Sick sinus syndrome: Secondary | ICD-10-CM

## 2022-12-27 LAB — CUP PACEART REMOTE DEVICE CHECK
Battery Remaining Longevity: 138 mo
Battery Voltage: 3.02 V
Brady Statistic AP VP Percent: 0.28 %
Brady Statistic AP VS Percent: 99.18 %
Brady Statistic AS VP Percent: 0.01 %
Brady Statistic AS VS Percent: 0.53 %
Brady Statistic RA Percent Paced: 99.75 %
Brady Statistic RV Percent Paced: 0.29 %
Date Time Interrogation Session: 20241015002703
Implantable Lead Connection Status: 753985
Implantable Lead Connection Status: 753985
Implantable Lead Implant Date: 20221104
Implantable Lead Implant Date: 20221104
Implantable Lead Location: 753859
Implantable Lead Location: 753860
Implantable Lead Model: 3830
Implantable Lead Model: 5076
Implantable Pulse Generator Implant Date: 20221104
Lead Channel Impedance Value: 285 Ohm
Lead Channel Impedance Value: 380 Ohm
Lead Channel Impedance Value: 418 Ohm
Lead Channel Impedance Value: 589 Ohm
Lead Channel Pacing Threshold Amplitude: 0.5 V
Lead Channel Pacing Threshold Amplitude: 0.625 V
Lead Channel Pacing Threshold Pulse Width: 0.4 ms
Lead Channel Pacing Threshold Pulse Width: 0.4 ms
Lead Channel Sensing Intrinsic Amplitude: 2.375 mV
Lead Channel Sensing Intrinsic Amplitude: 2.375 mV
Lead Channel Sensing Intrinsic Amplitude: 24.25 mV
Lead Channel Sensing Intrinsic Amplitude: 24.25 mV
Lead Channel Setting Pacing Amplitude: 1.5 V
Lead Channel Setting Pacing Amplitude: 2 V
Lead Channel Setting Pacing Pulse Width: 0.4 ms
Lead Channel Setting Sensing Sensitivity: 0.9 mV
Zone Setting Status: 755011
Zone Setting Status: 755011

## 2023-01-15 NOTE — Progress Notes (Signed)
Remote pacemaker transmission.   

## 2023-03-27 ENCOUNTER — Ambulatory Visit (INDEPENDENT_AMBULATORY_CARE_PROVIDER_SITE_OTHER): Payer: Medicare Other

## 2023-03-27 DIAGNOSIS — I495 Sick sinus syndrome: Secondary | ICD-10-CM

## 2023-03-27 LAB — CUP PACEART REMOTE DEVICE CHECK
Battery Remaining Longevity: 137 mo
Battery Voltage: 3.01 V
Brady Statistic AP VP Percent: 0.36 %
Brady Statistic AP VS Percent: 99.03 %
Brady Statistic AS VP Percent: 0.01 %
Brady Statistic AS VS Percent: 0.6 %
Brady Statistic RA Percent Paced: 99.75 %
Brady Statistic RV Percent Paced: 0.37 %
Date Time Interrogation Session: 20250113214702
Implantable Lead Connection Status: 753985
Implantable Lead Connection Status: 753985
Implantable Lead Implant Date: 20221104
Implantable Lead Implant Date: 20221104
Implantable Lead Location: 753859
Implantable Lead Location: 753860
Implantable Lead Model: 3830
Implantable Lead Model: 5076
Implantable Pulse Generator Implant Date: 20221104
Lead Channel Impedance Value: 304 Ohm
Lead Channel Impedance Value: 418 Ohm
Lead Channel Impedance Value: 456 Ohm
Lead Channel Impedance Value: 608 Ohm
Lead Channel Pacing Threshold Amplitude: 0.625 V
Lead Channel Pacing Threshold Amplitude: 0.75 V
Lead Channel Pacing Threshold Pulse Width: 0.4 ms
Lead Channel Pacing Threshold Pulse Width: 0.4 ms
Lead Channel Sensing Intrinsic Amplitude: 1.125 mV
Lead Channel Sensing Intrinsic Amplitude: 1.125 mV
Lead Channel Sensing Intrinsic Amplitude: 27.625 mV
Lead Channel Sensing Intrinsic Amplitude: 27.625 mV
Lead Channel Setting Pacing Amplitude: 1.5 V
Lead Channel Setting Pacing Amplitude: 2 V
Lead Channel Setting Pacing Pulse Width: 0.4 ms
Lead Channel Setting Sensing Sensitivity: 0.9 mV
Zone Setting Status: 755011
Zone Setting Status: 755011

## 2023-05-09 ENCOUNTER — Encounter: Payer: Self-pay | Admitting: Internal Medicine

## 2023-05-10 NOTE — Progress Notes (Signed)
 Remote pacemaker transmission.

## 2023-06-26 ENCOUNTER — Ambulatory Visit (INDEPENDENT_AMBULATORY_CARE_PROVIDER_SITE_OTHER): Payer: PRIVATE HEALTH INSURANCE

## 2023-06-26 DIAGNOSIS — I495 Sick sinus syndrome: Secondary | ICD-10-CM

## 2023-06-28 LAB — CUP PACEART REMOTE DEVICE CHECK
Battery Remaining Longevity: 133 mo
Battery Voltage: 3.01 V
Brady Statistic AP VP Percent: 0.25 %
Brady Statistic AP VS Percent: 99.21 %
Brady Statistic AS VP Percent: 0.01 %
Brady Statistic AS VS Percent: 0.54 %
Brady Statistic RA Percent Paced: 99.74 %
Brady Statistic RV Percent Paced: 0.26 %
Date Time Interrogation Session: 20250415042102
Implantable Lead Connection Status: 753985
Implantable Lead Connection Status: 753985
Implantable Lead Implant Date: 20221104
Implantable Lead Implant Date: 20221104
Implantable Lead Location: 753859
Implantable Lead Location: 753860
Implantable Lead Model: 3830
Implantable Lead Model: 5076
Implantable Pulse Generator Implant Date: 20221104
Lead Channel Impedance Value: 285 Ohm
Lead Channel Impedance Value: 399 Ohm
Lead Channel Impedance Value: 437 Ohm
Lead Channel Impedance Value: 589 Ohm
Lead Channel Pacing Threshold Amplitude: 0.625 V
Lead Channel Pacing Threshold Amplitude: 0.625 V
Lead Channel Pacing Threshold Pulse Width: 0.4 ms
Lead Channel Pacing Threshold Pulse Width: 0.4 ms
Lead Channel Sensing Intrinsic Amplitude: 0.5 mV
Lead Channel Sensing Intrinsic Amplitude: 0.5 mV
Lead Channel Sensing Intrinsic Amplitude: 26.75 mV
Lead Channel Sensing Intrinsic Amplitude: 26.75 mV
Lead Channel Setting Pacing Amplitude: 1.5 V
Lead Channel Setting Pacing Amplitude: 2 V
Lead Channel Setting Pacing Pulse Width: 0.4 ms
Lead Channel Setting Sensing Sensitivity: 0.9 mV
Zone Setting Status: 755011
Zone Setting Status: 755011

## 2023-07-05 ENCOUNTER — Encounter: Payer: Self-pay | Admitting: Internal Medicine

## 2023-07-26 ENCOUNTER — Encounter: Admitting: Internal Medicine

## 2023-08-01 ENCOUNTER — Ambulatory Visit: Attending: Cardiology | Admitting: Cardiology

## 2023-08-01 ENCOUNTER — Encounter: Payer: Self-pay | Admitting: Cardiology

## 2023-08-01 VITALS — BP 128/70 | HR 75 | Ht 72.0 in | Wt 198.8 lb

## 2023-08-01 DIAGNOSIS — Z950 Presence of cardiac pacemaker: Secondary | ICD-10-CM | POA: Diagnosis not present

## 2023-08-01 DIAGNOSIS — I25118 Atherosclerotic heart disease of native coronary artery with other forms of angina pectoris: Secondary | ICD-10-CM | POA: Diagnosis not present

## 2023-08-01 DIAGNOSIS — I495 Sick sinus syndrome: Secondary | ICD-10-CM | POA: Diagnosis not present

## 2023-08-01 DIAGNOSIS — I10 Essential (primary) hypertension: Secondary | ICD-10-CM | POA: Diagnosis not present

## 2023-08-01 LAB — CUP PACEART INCLINIC DEVICE CHECK
Date Time Interrogation Session: 20250521152846
Implantable Lead Connection Status: 753985
Implantable Lead Connection Status: 753985
Implantable Lead Implant Date: 20221104
Implantable Lead Implant Date: 20221104
Implantable Lead Location: 753859
Implantable Lead Location: 753860
Implantable Lead Model: 3830
Implantable Lead Model: 5076
Implantable Pulse Generator Implant Date: 20221104

## 2023-08-01 NOTE — Progress Notes (Signed)
 Electrophysiology Clinic Note    Date:  08/01/2023  Patient ID:  Roger Austin, Roger Austin Jan 10, 1956, MRN [id number] PCP:  Antonio Baumgarten, MD  Cardiologist:  Antionette Kirks, MD Electrophysiologist: Richardo Chandler, MD   Discussed the use of AI scribe software for clinical note transcription with the patient, who gave verbal consent to proceed.   Patient Profile    Chief Complaint: yearly PPM follow-up  History of Present Illness: [name] is a 68 y.o. male with PMH notable for SND s/p PPM, CAD s/p PCI x 2, HTN, CVA; seen today for Richardo Chandler, MD for routine electrophysiology followup.   He last saw Dr. Rodolfo Clan 06/2022, main complaint was SOB with bending over and LH.   On follow-up today, he overall feels very well. 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 He intermittently feels a muscle twitch in his heart, believes it is when the ppm is working in his ventricles. It rarely happens and is not concerning when it does.  He has noticed that he does not have the endurance he used to have, feels like his HR does not increase as it used to with activity.  He denies chest pain, chest pressure, palpitations, SOB.    Arrhythmia/Device History MDT single chamber PPM - initial implant 1985  Device revision 2022  - revision of RA - LBBA      ROS:  Please see the history of present illness. All other systems are reviewed and otherwise negative.    Physical Exam    VS:  BP 128/70 (BP Location: Left Arm, Patient Position: Sitting)   Pulse 75   Ht 6' (1.829 m)   Wt 198 lb 12.8 oz (90.2 kg)   SpO2 98%   BMI 26.96 kg/m  BMI: Body mass index is 26.96 kg/m.  Wt Readings from Last 3 Encounters:  08/01/23 198 lb 12.8 oz (90.2 kg)  06/27/22 209 lb (94.8 kg)  06/20/22 209 lb (94.8 kg)     GEN- The patient is well appearing, alert and oriented x 3 today.   Lungs- Clear to ausculation bilaterally, normal work of breathing.  Heart- Regular rate and  rhythm, no murmurs, rubs or gallops Extremities- No peripheral edema, warm, dry Skin-  device pocket well-healed, no tethering   Device interrogation done today and reviewed by myself:  Battery 11 years Lead thresholds, impedence, sensing stable  Low VP No episodes Adjusted RR - Slightly increased ADL Rate from 100 > 105 - Increased exertion response from 2 > 3  Studies Reviewed   Previous EP, cardiology notes.    EKG is ordered. Personal review of EKG from today shows:    EKG Interpretation Date/Time:  Wednesday Aug 01 2023 14:32:08 EDT Ventricular Rate:  64 PR Interval:  268 QRS Duration:  134 QT Interval:  432 QTC Calculation: 445 R Axis:   69  Text Interpretation: Atrial-paced rhythm with prolonged AV conduction Right bundle branch block Confirmed by Karalyne Nusser [phone]) on 08/01/2023 2:43:41 PM     TTE, 11/23/2020  1. Left ventricular ejection fraction, by estimation, is 60 to 65%. The left ventricle has normal function. The left ventricle has no regional wall motion abnormalities. There is mild left ventricular hypertrophy. Left ventricular diastolic parameters are consistent with Grade I diastolic dysfunction (impaired relaxation). Elevated left atrial pressure.   2. Right ventricular systolic function is normal. The right ventricular size is mildly enlarged. There is mildly elevated pulmonary artery systolic pressure.   3.  Right atrial size was mildly dilated.   4. The mitral valve is normal in structure. Trivial mitral valve regurgitation. No evidence of mitral stenosis.   5. Tricuspid valve regurgitation is moderate.   6. The aortic valve is tricuspid. Aortic valve regurgitation is not visualized. No aortic stenosis is present.   7. The inferior vena cava is normal in size with <50% respiratory variability, suggesting right atrial pressure of 8 mmHg.   Comparison(s): LVEF 55-60%.   R/LHC, 06/18/2020 Previously placed Ost RCA drug eluting stent is widely  patent. Previously placed Mid RCA drug eluting stent is widely patent. The left ventricular systolic function is normal. LV end diastolic pressure is mildly elevated. The left ventricular ejection fraction is 55-65% by visual estimate. Mid RCA to Dist RCA lesion is 70% stenosed.   1.  Significant one-vessel coronary artery disease with patent RCA stents.  There is a 70% stenosis in the mid/distal right coronary artery supplying a small area.  Vessel diameter is 2 mm at the site of stenosis.  No significant disease affecting the left coronary system.  Codominant coronary arteries. 2.  Normal LV systolic function. 3.  Right heart catheterization showed mildly elevated right atrial pressure, normal pulmonary pressure and normal cardiac output.  Assessment and Plan     #) SND s/p PPM Device functioning well, see pace art for details Low VP, near 100% AP Adjusted RR as above  #) CAD s/p PCI Doing well without ischemic s/s  #) HTN Normotensive in office today Continue 2.5mg  amlodipine       Current medicines are reviewed at length with the patient today.   The patient does not have concerns regarding his medicines.  The following changes were made today:  none  Labs/ tests ordered today include:  Orders Placed This Encounter  Procedures   EKG 12-Lead     Disposition: Follow up with Dr. Daneil Dunker or EP APP in 12 months, prefer MD to establish with new MD   Follow-up with Dr. Alvenia Aus or PA Dunn in 6 months for routine gen cards follow-up   Signed, Adaline Holly, NP  08/01/23  3:19 PM  Electrophysiology [facility]

## 2023-08-01 NOTE — Patient Instructions (Signed)
 Medication Instructions:  The current medical regimen is effective;  continue present plan and medications as directed. Please refer to the Current Medication list given to you today.   *If you need a refill on your cardiac medications before your next appointment, please call your pharmacy*   Follow-Up: At [facility], you and your health needs are our priority.  As part of our continuing mission to provide you with exceptional heart care, our providers are all part of one team.  This team includes your primary Cardiologist (physician) and Advanced Practice Providers or APPs (Physician Assistants and Nurse Practitioners) who all work together to provide you with the care you need, when you need it.  Your next appointment:   12 month(s)  Provider:   Ardeen Kohler, MD or Suzann Riddle, NP    We recommend signing up for the patient portal called "MyChart".  Sign up information is provided on this After Visit Summary.  MyChart is used to connect with patients for Virtual Visits (Telemedicine).  Patients are able to view lab/test results, encounter notes, upcoming appointments, etc.  Non-urgent messages can be sent to your provider as well.   To learn more about what you can do with MyChart, go to [email or web address].

## 2023-08-08 ENCOUNTER — Ambulatory Visit: Payer: Self-pay | Admitting: Cardiology

## 2023-08-10 NOTE — Progress Notes (Signed)
 Remote pacemaker transmission.

## 2023-08-10 NOTE — Addendum Note (Signed)
 Addended by: Lott Rouleau A on: 08/10/2023 03:49 PM   Modules accepted: Orders

## 2023-09-25 ENCOUNTER — Ambulatory Visit: Payer: PRIVATE HEALTH INSURANCE

## 2023-09-25 DIAGNOSIS — I495 Sick sinus syndrome: Secondary | ICD-10-CM | POA: Diagnosis not present

## 2023-09-26 LAB — CUP PACEART REMOTE DEVICE CHECK
Battery Remaining Longevity: 130 mo
Battery Voltage: 3.01 V
Brady Statistic AP VP Percent: 0.73 %
Brady Statistic AP VS Percent: 98.33 %
Brady Statistic AS VP Percent: 0.02 %
Brady Statistic AS VS Percent: 0.92 %
Brady Statistic RA Percent Paced: 99.65 %
Brady Statistic RV Percent Paced: 0.75 %
Date Time Interrogation Session: 20250714212351
Implantable Lead Connection Status: 753985
Implantable Lead Connection Status: 753985
Implantable Lead Implant Date: 20221104
Implantable Lead Implant Date: 20221104
Implantable Lead Location: 753859
Implantable Lead Location: 753860
Implantable Lead Model: 3830
Implantable Lead Model: 5076
Implantable Pulse Generator Implant Date: 20221104
Lead Channel Impedance Value: 285 Ohm
Lead Channel Impedance Value: 399 Ohm
Lead Channel Impedance Value: 437 Ohm
Lead Channel Impedance Value: 589 Ohm
Lead Channel Pacing Threshold Amplitude: 0.625 V
Lead Channel Pacing Threshold Amplitude: 0.75 V
Lead Channel Pacing Threshold Pulse Width: 0.4 ms
Lead Channel Pacing Threshold Pulse Width: 0.4 ms
Lead Channel Sensing Intrinsic Amplitude: 0.625 mV
Lead Channel Sensing Intrinsic Amplitude: 0.625 mV
Lead Channel Sensing Intrinsic Amplitude: 25.375 mV
Lead Channel Sensing Intrinsic Amplitude: 25.375 mV
Lead Channel Setting Pacing Amplitude: 1.5 V
Lead Channel Setting Pacing Amplitude: 2 V
Lead Channel Setting Pacing Pulse Width: 0.4 ms
Lead Channel Setting Sensing Sensitivity: 0.9 mV
Zone Setting Status: 755011
Zone Setting Status: 755011

## 2023-12-19 NOTE — Progress Notes (Signed)
 Remote PPM Transmission

## 2023-12-25 ENCOUNTER — Ambulatory Visit: Payer: PRIVATE HEALTH INSURANCE

## 2023-12-25 DIAGNOSIS — I495 Sick sinus syndrome: Secondary | ICD-10-CM | POA: Diagnosis not present

## 2023-12-26 LAB — CUP PACEART REMOTE DEVICE CHECK
Battery Remaining Longevity: 127 mo
Battery Voltage: 3.01 V
Brady Statistic AP VP Percent: 0.57 %
Brady Statistic AP VS Percent: 98.46 %
Brady Statistic AS VP Percent: 0.03 %
Brady Statistic AS VS Percent: 0.94 %
Brady Statistic RA Percent Paced: 99.59 %
Brady Statistic RV Percent Paced: 0.6 %
Date Time Interrogation Session: 20251013210232
Implantable Lead Connection Status: 753985
Implantable Lead Connection Status: 753985
Implantable Lead Implant Date: 20221104
Implantable Lead Implant Date: 20221104
Implantable Lead Location: 753859
Implantable Lead Location: 753860
Implantable Lead Model: 3830
Implantable Lead Model: 5076
Implantable Pulse Generator Implant Date: 20221104
Lead Channel Impedance Value: 285 Ohm
Lead Channel Impedance Value: 399 Ohm
Lead Channel Impedance Value: 437 Ohm
Lead Channel Impedance Value: 551 Ohm
Lead Channel Pacing Threshold Amplitude: 0.75 V
Lead Channel Pacing Threshold Amplitude: 0.875 V
Lead Channel Pacing Threshold Pulse Width: 0.4 ms
Lead Channel Pacing Threshold Pulse Width: 0.4 ms
Lead Channel Sensing Intrinsic Amplitude: 0.375 mV
Lead Channel Sensing Intrinsic Amplitude: 0.375 mV
Lead Channel Sensing Intrinsic Amplitude: 26.125 mV
Lead Channel Sensing Intrinsic Amplitude: 26.125 mV
Lead Channel Setting Pacing Amplitude: 1.5 V
Lead Channel Setting Pacing Amplitude: 2 V
Lead Channel Setting Pacing Pulse Width: 0.4 ms
Lead Channel Setting Sensing Sensitivity: 0.9 mV
Zone Setting Status: 755011
Zone Setting Status: 755011

## 2023-12-28 NOTE — Progress Notes (Signed)
 Remote PPM Transmission

## 2024-01-19 ENCOUNTER — Ambulatory Visit: Payer: Self-pay | Admitting: Cardiology

## 2024-01-21 ENCOUNTER — Ambulatory Visit: Payer: Self-pay | Admitting: Cardiology

## 2024-03-25 ENCOUNTER — Ambulatory Visit: Attending: Cardiology

## 2024-03-25 DIAGNOSIS — I495 Sick sinus syndrome: Secondary | ICD-10-CM | POA: Diagnosis not present

## 2024-03-26 LAB — CUP PACEART REMOTE DEVICE CHECK
Battery Remaining Longevity: 122 mo
Battery Voltage: 3 V
Brady Statistic AP VP Percent: 0.59 %
Brady Statistic AP VS Percent: 98.47 %
Brady Statistic AS VP Percent: 0.02 %
Brady Statistic AS VS Percent: 0.91 %
Brady Statistic RA Percent Paced: 99.66 %
Brady Statistic RV Percent Paced: 0.61 %
Date Time Interrogation Session: 20260113025216
Implantable Lead Connection Status: 753985
Implantable Lead Connection Status: 753985
Implantable Lead Implant Date: 20221104
Implantable Lead Implant Date: 20221104
Implantable Lead Location: 753859
Implantable Lead Location: 753860
Implantable Lead Model: 3830
Implantable Lead Model: 5076
Implantable Pulse Generator Implant Date: 20221104
Lead Channel Impedance Value: 285 Ohm
Lead Channel Impedance Value: 399 Ohm
Lead Channel Impedance Value: 399 Ohm
Lead Channel Impedance Value: 570 Ohm
Lead Channel Pacing Threshold Amplitude: 0.75 V
Lead Channel Pacing Threshold Amplitude: 0.75 V
Lead Channel Pacing Threshold Pulse Width: 0.4 ms
Lead Channel Pacing Threshold Pulse Width: 0.4 ms
Lead Channel Sensing Intrinsic Amplitude: 0.625 mV
Lead Channel Sensing Intrinsic Amplitude: 0.625 mV
Lead Channel Sensing Intrinsic Amplitude: 22.25 mV
Lead Channel Sensing Intrinsic Amplitude: 22.25 mV
Lead Channel Setting Pacing Amplitude: 1.5 V
Lead Channel Setting Pacing Amplitude: 2 V
Lead Channel Setting Pacing Pulse Width: 0.4 ms
Lead Channel Setting Sensing Sensitivity: 0.9 mV
Zone Setting Status: 755011
Zone Setting Status: 755011

## 2024-03-30 ENCOUNTER — Ambulatory Visit: Payer: Self-pay | Admitting: Cardiology

## 2024-03-31 NOTE — Progress Notes (Signed)
 Remote PPM Transmission

## 2024-06-24 ENCOUNTER — Ambulatory Visit

## 2024-09-23 ENCOUNTER — Ambulatory Visit

## 2024-12-23 ENCOUNTER — Ambulatory Visit

## 2025-03-24 ENCOUNTER — Ambulatory Visit
